# Patient Record
Sex: Male | Born: 1985 | Hispanic: Yes | Marital: Married | Smoking: Former smoker
Health system: Southern US, Community
[De-identification: ages and names within clinical notes are randomized; demographics above are authoritative.]

## PROBLEM LIST (undated history)

## (undated) DIAGNOSIS — R7303 Prediabetes: Secondary | ICD-10-CM

## (undated) HISTORY — PX: CHOLECYSTECTOMY: SHX55

## (undated) HISTORY — PX: COLONOSCOPY: SHX174

---

## 2004-06-23 ENCOUNTER — Emergency Department: Payer: Self-pay | Admitting: Emergency Medicine

## 2008-05-14 ENCOUNTER — Emergency Department: Payer: Self-pay | Admitting: Emergency Medicine

## 2008-06-05 ENCOUNTER — Ambulatory Visit: Payer: Self-pay | Admitting: Urology

## 2008-08-17 ENCOUNTER — Emergency Department: Payer: Self-pay | Admitting: Emergency Medicine

## 2008-12-04 ENCOUNTER — Emergency Department: Payer: Self-pay | Admitting: Emergency Medicine

## 2015-05-21 DIAGNOSIS — Z87891 Personal history of nicotine dependence: Secondary | ICD-10-CM | POA: Insufficient documentation

## 2015-05-21 DIAGNOSIS — Y998 Other external cause status: Secondary | ICD-10-CM | POA: Insufficient documentation

## 2015-05-21 DIAGNOSIS — W231XXA Caught, crushed, jammed, or pinched between stationary objects, initial encounter: Secondary | ICD-10-CM | POA: Insufficient documentation

## 2015-05-21 DIAGNOSIS — Y9389 Activity, other specified: Secondary | ICD-10-CM | POA: Insufficient documentation

## 2015-05-21 DIAGNOSIS — Y9289 Other specified places as the place of occurrence of the external cause: Secondary | ICD-10-CM | POA: Insufficient documentation

## 2015-05-21 DIAGNOSIS — S60413A Abrasion of left middle finger, initial encounter: Secondary | ICD-10-CM | POA: Insufficient documentation

## 2015-05-21 DIAGNOSIS — S60415A Abrasion of left ring finger, initial encounter: Secondary | ICD-10-CM | POA: Insufficient documentation

## 2015-05-22 ENCOUNTER — Emergency Department: Payer: Self-pay

## 2015-05-22 ENCOUNTER — Encounter: Payer: Self-pay | Admitting: Emergency Medicine

## 2015-05-22 ENCOUNTER — Emergency Department
Admission: EM | Admit: 2015-05-22 | Discharge: 2015-05-22 | Disposition: A | Discharge status: Home / Self Care | Payer: Self-pay | Attending: Emergency Medicine | Admitting: Emergency Medicine

## 2015-05-22 DIAGNOSIS — T148XXA Other injury of unspecified body region, initial encounter: Secondary | ICD-10-CM

## 2015-05-22 HISTORY — DX: Prediabetes: R73.03

## 2015-05-22 NOTE — ED Provider Notes (Signed)
[facility] Emergency Department Provider Note  ____________________________________________  Time seen: Approximately 1:54 AM  I have reviewed the triage vital signs and the nursing notes.   HISTORY  Chief Complaint Finger Injury    HPI Wesley Meyer is a 29 y.o. male patient reports he was getting money out of the machine and the money got stuck for a little bit and so the door closed on his finger and got the DIP joint of his left long finger and ring finger.  Past Medical History  Diagnosis Date  . Pre-diabetes     There are no active problems to display for this patient.   Past Surgical History  Procedure Laterality Date  . Cholecystectomy      No current outpatient prescriptions on file.  Allergies Review of patient's allergies indicates no known allergies.  No family history on file.  Social History Social History  Substance Use Topics  . Smoking status: Former Games developer  . Smokeless tobacco: None  . Alcohol Use: Yes    Review of Systems Constitutional: No fever/chills Eyes: No visual changes. ENT: No sore throat. Cardiovascular: Denies chest pain. Respiratory: Denies shortness of breath. Gastrointestinal: No abdominal pain.  No nausea, no vomiting.  No diarrhea.  No constipation. Genitourinary: Negative for dysuria. Musculoskeletal: Negative for back pain. Skin: Negative for rash.  10-point ROS otherwise negative.  ____________________________________________   PHYSICAL EXAM:  VITAL SIGNS: ED Triage Vitals  Enc Vitals Group     BP 05/22/15 0009 159/84 mmHg     Pulse Rate 05/22/15 0009 84     Resp 05/22/15 0009 19     Temp 05/22/15 0009 98.5 F (36.9 C)     Temp Source 05/22/15 0009 Oral     SpO2 05/22/15 0009 98 %     Weight 05/22/15 0009 360 lb (163.295 kg)     Height 05/22/15 0009 6' (1.829 m)     Head Cir --      Peak Flow --      Pain Score 05/22/15 0009 2     Pain Loc --      Pain Edu? --      Excl.  in GC? --     Constitutional: Alert and oriented. Well appearing and in no acute distress. Eyes: Conjunctivae are normal. PERRL. EOMI. Head: Atraumatic. Nose: No congestion/rhinnorhea. Mouth/Throat: Mucous membranes are moist.  Oropharynx non-erythematous. Musculoskeletal: No lower extremity tenderness nor edema.  No joint effusions.patient has superficial laceration over the DIP joint of both the left long and ring fingers. Sensation capillary refill is intact distally. Patient has good strength in the distal phalanx and is able to move both fingers distal phalanges through the full range of motion even when the middle phalanx is stabilized.  Neurologic:  Normal speech and language. No gross focal neurologic deficits are appreciated.  Skin:  Skin is warm, dry and intact. No rash noted. Psychiatric: Mood and affect are normal. Speech and behavior are normal.  ____________________________________________   LABS (all labs ordered are listed, but only abnormal results are displayed)  Labs Reviewed - No data to display ____________________________________________  EKG   ____________________________________________  RADIOLOGY  X-ray read by radiology as no osseous injury there is a small amount of skin disruption visible on x-ray ____________________________________________   PROCEDURES  Wound was cleaned and examined the skin appears to be tightly tear and I'm not sure that there is any deep injury at all I cannot find one. ____________________________________________   INITIAL IMPRESSION /  ASSESSMENT AND PLAN / ED COURSE  Pertinent labs & imaging results that were available during my care of the patient were reviewed by me and considered in my medical decision making (see chart for details).   ____________________________________________   FINAL CLINICAL IMPRESSION(S) / ED DIAGNOSES  Final diagnoses:  Superficial laceration      Arnaldo NatalPaul F Dontrell Stuck, MD 05/22/15 [phone]

## 2015-05-22 NOTE — ED Notes (Signed)
MD at bedside. 

## 2015-05-22 NOTE — ED Notes (Signed)
Pt presents to ED via POV with c/o of left side 2nd and 3rd digits. Pt states he was removing cash from ATM when the slid door closed on affected digits. Pt is alert and oriented x4, able to move all extremities without difficulty. CMS intact. Small amount of bleeding noted to both digits, bleeding controlled with triage bandage.

## 2015-05-22 NOTE — Discharge Instructions (Signed)
Keep area clean and dry. If you get it dirty changed bandage. You can wash her hands but do not submerge her hands in standing water. Make sure he changed manage to get your hands wet. Return for any redness swelling pus or increased pain or difficulty with moving her fingers.

## 2016-09-09 ENCOUNTER — Encounter: Payer: Self-pay | Admitting: Emergency Medicine

## 2016-09-09 ENCOUNTER — Emergency Department
Admission: EM | Admit: 2016-09-09 | Discharge: 2016-09-09 | Disposition: A | Discharge status: Home / Self Care | Payer: Self-pay | Attending: Emergency Medicine | Admitting: Emergency Medicine

## 2016-09-09 ENCOUNTER — Emergency Department: Payer: Self-pay

## 2016-09-09 DIAGNOSIS — Y9389 Activity, other specified: Secondary | ICD-10-CM | POA: Insufficient documentation

## 2016-09-09 DIAGNOSIS — Y999 Unspecified external cause status: Secondary | ICD-10-CM | POA: Insufficient documentation

## 2016-09-09 DIAGNOSIS — W1839XA Other fall on same level, initial encounter: Secondary | ICD-10-CM | POA: Insufficient documentation

## 2016-09-09 DIAGNOSIS — M25561 Pain in right knee: Secondary | ICD-10-CM

## 2016-09-09 DIAGNOSIS — Z87891 Personal history of nicotine dependence: Secondary | ICD-10-CM | POA: Insufficient documentation

## 2016-09-09 DIAGNOSIS — Y92007 Garden or yard of unspecified non-institutional (private) residence as the place of occurrence of the external cause: Secondary | ICD-10-CM | POA: Insufficient documentation

## 2016-09-09 MED ORDER — OXYCODONE-ACETAMINOPHEN 5-325 MG PO TABS: 1.0000 | ORAL_TABLET | Freq: Four times a day (QID) | ORAL | 0 refills | Status: DC | PRN

## 2016-09-09 MED ORDER — FENTANYL CITRATE (PF) 100 MCG/2ML IJ SOLN
100.0000 ug | Freq: Once | INTRAMUSCULAR | Status: AC
  Administered 2016-09-09: 100 ug via INTRAVENOUS

## 2016-09-09 MED ORDER — FENTANYL CITRATE (PF) 100 MCG/2ML IJ SOLN
INTRAMUSCULAR | Status: AC
  Administered 2016-09-09: 100 ug
  Filled 2016-09-09: qty 2

## 2016-09-09 MED ORDER — FENTANYL CITRATE (PF) 100 MCG/2ML IJ SOLN
INTRAMUSCULAR | Status: DC
Start: 2016-09-09 — End: 2016-09-09
  Filled 2016-09-09: qty 2

## 2016-09-09 NOTE — ED Triage Notes (Signed)
Patient comes from home via private car with leg pain. Patient was pulling a branch from the ground and pulled too hard and it broke and landed with all his weight on his right leg.

## 2016-09-09 NOTE — Discharge Instructions (Signed)
Please wear your knee immobilizer. Use crutches with weightbearing as tolerated. Please call the number provided for orthopedics on Monday to arrange a follow-up appointment early this week. Return to the emergency department for any personally concerning symptoms.

## 2016-09-09 NOTE — ED Notes (Signed)
Pt returned from xray

## 2016-09-09 NOTE — ED Provider Notes (Signed)
[facility] Emergency Department Provider Note  Time seen: 4:49 PM  I have reviewed the triage vital signs and the nursing notes.   HISTORY  Chief Complaint Leg Pain    HPI Wesley Meyer is a 31 y.o. male with no past medical history besides obesity, who presents to the emergency department with right knee pain. According to the patient he was working in the yard when he fell landing on a folded right leg. Patient states immediate pain to the knee, unable to bear weight due to the pain. States he felt a pop. Patient states 10/10 right knee pain upon arrival.  Past Medical History:  Diagnosis Date  . Pre-diabetes     There are no active problems to display for this patient.   Past Surgical History:  Procedure Laterality Date  . CHOLECYSTECTOMY      Prior to Admission medications   Not on File    No Known Allergies  No family history on file.  Social History Social History  Substance Use Topics  . Smoking status: Former Games developer  . Smokeless tobacco: Never Used  . Alcohol use Yes    Review of Systems Constitutional: Negative for fever. Cardiovascular: Negative for chest pain. Respiratory: Negative for shortness of breath. Gastrointestinal: Negative for abdominal pain Neurological: Negative for headache 10-point ROS otherwise negative.  ____________________________________________   PHYSICAL EXAM:  VITAL SIGNS: ED Triage Vitals [09/09/16 1640]  Enc Vitals Group     BP 134/79     Pulse Rate 93     Resp (!) 30     Temp      Temp src      SpO2 98 %     Weight (!) 380 lb (172.4 kg)     Height 6' (1.829 m)     Head Circumference      Peak Flow      Pain Score 10     Pain Loc      Pain Edu?      Excl. in GC?     Constitutional: Alert and oriented. Moderate distress due to right knee pain. Eyes: Normal exam ENT   Head: Normocephalic and atraumatic.   Mouth/Throat: Mucous membranes are moist. Cardiovascular:  Normal rate, regular rhythm. No murmur Respiratory: Normal respiratory effort without tachypnea nor retractions. Breath sounds are clear Gastrointestinal: Soft and nontender. No distention. Obese. Musculoskeletal: Patient with significant tenderness in knee especially just superior to the patella. Neurovascular intact distally with 2+ DP pulse. Able to move toes and feet. Neurologic:  Normal speech and language. No gross focal neurologic deficits  Skin:  Skin is warm, dry and intact.  Psychiatric: Mood and affect are normal.   ____________________________________________   RADIOLOGY  IMPRESSION: Tiny transverse lucency involving the posteromedial tibial plateau ridge possibly representing sequela of a PCL injury given patient's mechanism of injury and representing a nondisplaced fracture.  ____________________________________________   INITIAL IMPRESSION / ASSESSMENT AND PLAN / ED COURSE  Pertinent labs & imaging results that were available during my care of the patient were reviewed by me and considered in my medical decision making (see chart for details).  The patient presents to the emergency department with right lower extremity pain after a fall. Patient's pain is in the knee. Significant pain with palpation or any attempted movement. We will dose fentanyl for pain control we'll obtain x-ray imaging and closely monitor the patient.  Discussed the x-ray findings with Dr. Hyacinth Meeker of orthopedics. He suggests placing the patient in a  knee immobilizer. I discussed with Dr. Hyacinth Meeker that the patient is having difficulty with straight leg raises. He is able to slightly lift the leg off the bed with the leg in external rotation however when the leg is aligned straight he is not able to lift the leg off the bed. When bent at the knee the patient still cannot lift the foot off the bed. I lifted the foot passively and the patient is not able to maintain the foot off the bed. Dr. Hyacinth Meeker states he  will evaluate in the office. We'll place the patient in a knee immobilizer with crutches and weightbearing as tolerated place the patient on pain medication. Patient is agreeable.  ____________________________________________   FINAL CLINICAL IMPRESSION(S) / ED DIAGNOSES  Right knee pain    Minna Antis, MD 09/09/16 1819

## 2016-09-09 NOTE — ED Notes (Signed)
Knee immobilizer (the largest size) would not properly fit.  Dr. Lenard Lance OK'd the modifications

## 2016-09-13 DIAGNOSIS — S76111A Strain of right quadriceps muscle, fascia and tendon, initial encounter: Secondary | ICD-10-CM | POA: Insufficient documentation

## 2016-09-14 ENCOUNTER — Other Ambulatory Visit: Payer: Self-pay | Admitting: Specialist

## 2016-09-17 MED ORDER — CEFAZOLIN SODIUM-DEXTROSE 2-4 GM/100ML-% IV SOLN
2.0000 g | INTRAVENOUS | Status: AC
  Administered 2016-09-18: 2 g via INTRAVENOUS

## 2016-09-17 MED ORDER — CLINDAMYCIN PHOSPHATE 600 MG/50ML IV SOLN
600.0000 mg | Freq: Once | INTRAVENOUS | Status: AC
  Administered 2016-09-18: 600 mg via INTRAVENOUS

## 2016-09-18 ENCOUNTER — Ambulatory Visit
Admission: RE | Admit: 2016-09-18 | Discharge: 2016-09-18 | Disposition: A | Discharge status: Home / Self Care | Payer: Medicaid Other | Source: Ambulatory Visit | Attending: Specialist | Admitting: Specialist

## 2016-09-18 ENCOUNTER — Encounter: Payer: Self-pay | Admitting: *Deleted

## 2016-09-18 ENCOUNTER — Encounter
Admission: RE | Disposition: A | Discharge status: Home / Self Care | Payer: Self-pay | Source: Ambulatory Visit | Attending: Specialist

## 2016-09-18 ENCOUNTER — Ambulatory Visit: Payer: Medicaid Other | Admitting: Anesthesiology

## 2016-09-18 DIAGNOSIS — Z87891 Personal history of nicotine dependence: Secondary | ICD-10-CM | POA: Diagnosis not present

## 2016-09-18 DIAGNOSIS — X58XXXA Exposure to other specified factors, initial encounter: Secondary | ICD-10-CM | POA: Diagnosis not present

## 2016-09-18 DIAGNOSIS — S76191A Other specified injury of right quadriceps muscle, fascia and tendon, initial encounter: Secondary | ICD-10-CM | POA: Diagnosis not present

## 2016-09-18 HISTORY — PX: QUADRICEPS TENDON REPAIR: SHX756

## 2016-09-18 LAB — GLUCOSE, CAPILLARY
GLUCOSE-CAPILLARY: 152 mg/dL — AB (ref 65–99)
Glucose-Capillary: 118 mg/dL — ABNORMAL HIGH (ref 65–99)

## 2016-09-18 SURGERY — REPAIR, TENDON, QUADRICEPS
Anesthesia: General | Laterality: Right | Wound class: Clean

## 2016-09-18 MED ORDER — FENTANYL CITRATE (PF) 250 MCG/5ML IJ SOLN
INTRAMUSCULAR | Status: AC
  Filled 2016-09-18: qty 5

## 2016-09-18 MED ORDER — FAMOTIDINE 20 MG PO TABS
ORAL_TABLET | ORAL | Status: AC
  Filled 2016-09-18: qty 1

## 2016-09-18 MED ORDER — SUGAMMADEX SODIUM 500 MG/5ML IV SOLN
INTRAVENOUS | Status: DC | PRN
  Administered 2016-09-18: 350 mg via INTRAVENOUS

## 2016-09-18 MED ORDER — SUCCINYLCHOLINE CHLORIDE 20 MG/ML IJ SOLN
INTRAMUSCULAR | Status: DC | PRN
  Administered 2016-09-18: 200 mg via INTRAVENOUS

## 2016-09-18 MED ORDER — ONDANSETRON HCL 4 MG/2ML IJ SOLN
INTRAMUSCULAR | Status: DC | PRN
  Administered 2016-09-18: 4 mg via INTRAVENOUS

## 2016-09-18 MED ORDER — GABAPENTIN 400 MG PO CAPS
400.0000 mg | ORAL_CAPSULE | Freq: Once | ORAL | Status: AC
  Administered 2016-09-18: 400 mg via ORAL

## 2016-09-18 MED ORDER — FENTANYL CITRATE (PF) 100 MCG/2ML IJ SOLN
INTRAMUSCULAR | Status: DC | PRN
  Administered 2016-09-18: 50 ug via INTRAVENOUS
  Administered 2016-09-18: 100 ug via INTRAVENOUS
  Administered 2016-09-18: 50 ug via INTRAVENOUS
  Administered 2016-09-18: 100 ug via INTRAVENOUS

## 2016-09-18 MED ORDER — ROCURONIUM BROMIDE 50 MG/5ML IV SOLN
INTRAVENOUS | Status: AC
  Filled 2016-09-18: qty 1

## 2016-09-18 MED ORDER — SUGAMMADEX SODIUM 500 MG/5ML IV SOLN
INTRAVENOUS | Status: AC
  Filled 2016-09-18: qty 5

## 2016-09-18 MED ORDER — ROCURONIUM BROMIDE 100 MG/10ML IV SOLN
INTRAVENOUS | Status: DC | PRN
  Administered 2016-09-18: 20 mg via INTRAVENOUS
  Administered 2016-09-18: 50 mg via INTRAVENOUS

## 2016-09-18 MED ORDER — MIDAZOLAM HCL 2 MG/2ML IJ SOLN
INTRAMUSCULAR | Status: AC
  Filled 2016-09-18: qty 2

## 2016-09-18 MED ORDER — PROPOFOL 10 MG/ML IV BOLUS
INTRAVENOUS | Status: DC | PRN
  Administered 2016-09-18: 250 mg via INTRAVENOUS

## 2016-09-18 MED ORDER — CEFAZOLIN SODIUM-DEXTROSE 2-4 GM/100ML-% IV SOLN
INTRAVENOUS | Status: AC
  Filled 2016-09-18: qty 100

## 2016-09-18 MED ORDER — NEOMYCIN-POLYMYXIN B GU 40-200000 IR SOLN
Status: AC
  Filled 2016-09-18: qty 2

## 2016-09-18 MED ORDER — CHLORHEXIDINE GLUCONATE CLOTH 2 % EX PADS: 6.0000 | MEDICATED_PAD | Freq: Once | CUTANEOUS | Status: DC

## 2016-09-18 MED ORDER — MIDAZOLAM HCL 2 MG/2ML IJ SOLN
INTRAMUSCULAR | Status: DC | PRN
  Administered 2016-09-18: 2 mg via INTRAVENOUS

## 2016-09-18 MED ORDER — MELOXICAM 7.5 MG PO TABS
ORAL_TABLET | ORAL | Status: AC
  Filled 2016-09-18: qty 2

## 2016-09-18 MED ORDER — OXYCODONE HCL 5 MG PO TABS
5.0000 mg | ORAL_TABLET | Freq: Once | ORAL | Status: AC | PRN
  Administered 2016-09-18: 5 mg via ORAL

## 2016-09-18 MED ORDER — FENTANYL CITRATE (PF) 100 MCG/2ML IJ SOLN
INTRAMUSCULAR | Status: AC
  Administered 2016-09-18: 25 ug via INTRAVENOUS
  Filled 2016-09-18: qty 2

## 2016-09-18 MED ORDER — BUPIVACAINE-EPINEPHRINE (PF) 0.25% -1:200000 IJ SOLN
INTRAMUSCULAR | Status: AC
  Filled 2016-09-18: qty 30

## 2016-09-18 MED ORDER — OXYCODONE HCL 5 MG PO TABS
ORAL_TABLET | ORAL | Status: AC
  Administered 2016-09-18: 5 mg via ORAL
  Filled 2016-09-18: qty 1

## 2016-09-18 MED ORDER — NEOMYCIN-POLYMYXIN B GU 40-200000 IR SOLN
Status: DC | PRN
  Administered 2016-09-18: 4 mL

## 2016-09-18 MED ORDER — DEXAMETHASONE SODIUM PHOSPHATE 10 MG/ML IJ SOLN
INTRAMUSCULAR | Status: DC | PRN
  Administered 2016-09-18: 10 mg via INTRAVENOUS

## 2016-09-18 MED ORDER — DEXAMETHASONE SODIUM PHOSPHATE 10 MG/ML IJ SOLN
INTRAMUSCULAR | Status: AC
Start: 2016-09-18 — End: 2016-09-18
  Filled 2016-09-18: qty 1

## 2016-09-18 MED ORDER — GABAPENTIN 400 MG PO CAPS
ORAL_CAPSULE | ORAL | Status: AC
  Filled 2016-09-18: qty 1

## 2016-09-18 MED ORDER — OXYCODONE HCL 5 MG PO TABS
5.0000 mg | ORAL_TABLET | Freq: Once | ORAL | Status: AC
  Administered 2016-09-18: 5 mg via ORAL

## 2016-09-18 MED ORDER — FAMOTIDINE 20 MG PO TABS
20.0000 mg | ORAL_TABLET | Freq: Once | ORAL | Status: AC
  Administered 2016-09-18: 20 mg via ORAL

## 2016-09-18 MED ORDER — CLINDAMYCIN PHOSPHATE 600 MG/50ML IV SOLN
INTRAVENOUS | Status: AC
  Filled 2016-09-18: qty 50

## 2016-09-18 MED ORDER — BUPIVACAINE HCL (PF) 0.5 % IJ SOLN
INTRAMUSCULAR | Status: DC | PRN
  Administered 2016-09-18 (×2): 30 mL

## 2016-09-18 MED ORDER — SODIUM CHLORIDE 0.9 % IV SOLN
INTRAVENOUS | Status: DC
  Administered 2016-09-18 (×2): via INTRAVENOUS

## 2016-09-18 MED ORDER — MELOXICAM 7.5 MG PO TABS
15.0000 mg | ORAL_TABLET | Freq: Once | ORAL | Status: AC
  Administered 2016-09-18: 15 mg via ORAL

## 2016-09-18 MED ORDER — OXYMETAZOLINE HCL 0.05 % NA SOLN
NASAL | Status: AC
  Filled 2016-09-18: qty 15

## 2016-09-18 MED ORDER — ONDANSETRON HCL 4 MG/2ML IJ SOLN
INTRAMUSCULAR | Status: AC
  Filled 2016-09-18: qty 2

## 2016-09-18 MED ORDER — ACETAMINOPHEN 10 MG/ML IV SOLN
INTRAVENOUS | Status: AC
  Filled 2016-09-18: qty 100

## 2016-09-18 MED ORDER — BUPIVACAINE HCL (PF) 0.5 % IJ SOLN
INTRAMUSCULAR | Status: AC
  Filled 2016-09-18: qty 30

## 2016-09-18 MED ORDER — LIDOCAINE HCL (CARDIAC) 20 MG/ML IV SOLN
INTRAVENOUS | Status: DC | PRN
  Administered 2016-09-18: 100 mg via INTRAVENOUS

## 2016-09-18 MED ORDER — BUPIVACAINE-EPINEPHRINE (PF) 0.5% -1:200000 IJ SOLN
INTRAMUSCULAR | Status: AC
  Filled 2016-09-18: qty 30

## 2016-09-18 MED ORDER — GABAPENTIN 400 MG PO CAPS: 400.0000 mg | ORAL_CAPSULE | Freq: Two times a day (BID) | ORAL | 3 refills | Status: DC

## 2016-09-18 MED ORDER — FENTANYL CITRATE (PF) 100 MCG/2ML IJ SOLN
25.0000 ug | INTRAMUSCULAR | Status: DC | PRN
  Administered 2016-09-18 (×4): 25 ug via INTRAVENOUS

## 2016-09-18 MED ORDER — FENTANYL CITRATE (PF) 100 MCG/2ML IJ SOLN
INTRAMUSCULAR | Status: AC
  Filled 2016-09-18: qty 2

## 2016-09-18 MED ORDER — OXYCODONE HCL 5 MG/5ML PO SOLN: 5.0000 mg | Freq: Once | ORAL | Status: AC | PRN

## 2016-09-18 MED ORDER — HYDROCODONE-ACETAMINOPHEN 7.5-325 MG PO TABS: 1.0000 | ORAL_TABLET | Freq: Four times a day (QID) | ORAL | 0 refills | Status: DC | PRN

## 2016-09-18 MED ORDER — LIDOCAINE HCL 2 % EX GEL
CUTANEOUS | Status: AC
  Filled 2016-09-18: qty 5

## 2016-09-18 SURGICAL SUPPLY — 30 items
CANISTER SUCT 1200ML W/VALVE (MISCELLANEOUS) ×2 IMPLANT
CAST PADDING 6X4YD ST 30248 (SOFTGOODS) ×1
CHLORAPREP W/TINT 26ML (MISCELLANEOUS) ×2 IMPLANT
COOLER POLAR GLACIER W/PUMP (MISCELLANEOUS) ×2 IMPLANT
CUFF TOURN 24 STER (MISCELLANEOUS) IMPLANT
CUFF TOURN 30 STER DUAL PORT (MISCELLANEOUS) IMPLANT
ELECT REM PT RETURN 9FT ADLT (ELECTROSURGICAL) ×2
ELECTRODE REM PT RTRN 9FT ADLT (ELECTROSURGICAL) ×1 IMPLANT
GAUZE SPONGE 4X4 12PLY STRL (GAUZE/BANDAGES/DRESSINGS) ×2 IMPLANT
GAUZE XEROFORM 4X4 STRL (GAUZE/BANDAGES/DRESSINGS) ×2 IMPLANT
GLOVE INDICATOR 8.0 STRL GRN (GLOVE) ×2 IMPLANT
GLOVE SURG ORTHO 8.5 STRL (GLOVE) ×2 IMPLANT
GOWN STRL REUS W/ TWL LRG LVL3 (GOWN DISPOSABLE) ×1 IMPLANT
GOWN STRL REUS W/TWL LRG LVL3 (GOWN DISPOSABLE) ×1
GOWN STRL REUS W/TWL LRG LVL4 (GOWN DISPOSABLE) ×2 IMPLANT
IMMOB KNEE 24 THIGH 24 443303 (SOFTGOODS) ×2 IMPLANT
KIT RM TURNOVER STRD PROC AR (KITS) ×2 IMPLANT
NEEDLE SPNL 20GX3.5 QUINCKE YW (NEEDLE) ×2 IMPLANT
NS IRRIG 1000ML POUR BTL (IV SOLUTION) ×2 IMPLANT
PACK TOTAL KNEE (MISCELLANEOUS) ×2 IMPLANT
PAD WRAPON POLAR KNEE (MISCELLANEOUS) ×1 IMPLANT
PADDING CAST COTTON 6X4 ST (SOFTGOODS) ×1 IMPLANT
PASSER SUT SWANSON 36MM LOOP (INSTRUMENTS) ×2 IMPLANT
STAPLER SKIN PROX 35W (STAPLE) ×2 IMPLANT
STOCKINETTE BIAS CUT 6 980064 (GAUZE/BANDAGES/DRESSINGS) ×2 IMPLANT
SUT ORTHOCORD OS-6 NDL 36 (SUTURE) ×2 IMPLANT
SUT VIC AB 2-0 CT1 27 (SUTURE) ×1
SUT VIC AB 2-0 CT1 TAPERPNT 27 (SUTURE) ×1 IMPLANT
SYR 30ML LL (SYRINGE) ×4 IMPLANT
WRAPON POLAR PAD KNEE (MISCELLANEOUS) ×2

## 2016-09-18 NOTE — Discharge Instructions (Signed)

## 2016-09-18 NOTE — Op Note (Signed)
   09/18/2016  3:43 PM  PATIENT:  Wesley Meyer    PRE-OPERATIVE DIAGNOSIS:  S76.191A Inj right quadriceps muscle, fascia and tendon, init encntr  POST-OPERATIVE DIAGNOSIS:  Same  PROCEDURE:  REPAIR QUADRICEP TENDON  SURGEON:  Valinda Hoar, MD  ANESTHESIA:   General  PREOPERATIVE INDICATIONS:  [name] is a  31 y.o. male with a diagnosis of S76.191A Inj right quadriceps muscle, fascia and tendon, init encntr who failed conservative measures and elected for surgical management.    The risks benefits and alternatives were discussed with the patient preoperatively including but not limited to the risks of infection, bleeding, nerve injury, cardiopulmonary complications, the need for revision surgery, among others, and the patient was willing to proceed.  EBL: minimal  TOURNIQUET TIME: 30  MIN  OPERATIVE IMPLANTS: #5 Ethibond suture  OPERATIVE FINDINGS: Rupture of vastus medialis   OPERATIVE PROCEDURE: The patient was brought to the operating room and underwent general endotracheal anesthesia in the supine position. The operative leg was prepped and draped in a sterile fashion. Esmarch was applied and tourniquet inflated to 350 mmHg. An anterior midline incision was made and dissection carried out sharply through subcutaneous tissue. . The vastus medialis was seen to be torn from the patella medially with a large defect present into the joint. The vastus intermedius was intact.   A large amount of blood was removed with clots as well. The tendon ends were freshened up. The muscle and tendon was repaired with #5 Ethibond sutures. After irrigation,  subcutaneous tissue was closed with 2-0 Vicryl and the skin was closed with staples. Sponge and needle counts were correct. 1/2% Sensorcaine was placed in the soft tissues. Dry sterile dressing was applied along with  knee immobilizer. Tourniquet was deflated with good return of blood flow to the foot. The patient was  awakened and taken to recovery in good condition.  Valinda Hoar, MD

## 2016-09-18 NOTE — Anesthesia Postprocedure Evaluation (Signed)
Anesthesia Post Note  Patient: Wesley Meyer  Procedure(s) Performed: Procedure(s) (LRB): REPAIR QUADRICEP TENDON (Right)  Patient location during evaluation: PACU Anesthesia Type: General Level of consciousness: awake and alert and oriented Pain management: pain level controlled Vital Signs Assessment: post-procedure vital signs reviewed and stable Respiratory status: spontaneous breathing, nonlabored ventilation and respiratory function stable Cardiovascular status: blood pressure returned to baseline and stable Postop Assessment: no signs of nausea or vomiting Anesthetic complications: no     Last Vitals:  Vitals:   09/18/16 1617 09/18/16 1621  BP:  (!) 141/84  Pulse: 82 77  Resp: 19 (!) 26  Temp:      Last Pain:  Vitals:   09/18/16 1636  TempSrc:   PainSc: 6                  Shaelynn Dragos

## 2016-09-18 NOTE — H&P (Signed)
THE PATIENT WAS SEEN PRIOR TO SURGERY TODAY.  HISTORY, ALLERGIES, HOME MEDICATIONS AND OPERATIVE PROCEDURE WERE REVIEWED. RISKS AND BENEFITS OF SURGERY DISCUSSED WITH PATIENT AGAIN.  NO CHANGES FROM INITIAL HISTORY AND PHYSICAL NOTED.    

## 2016-09-18 NOTE — Anesthesia Post-op Follow-up Note (Cosign Needed)
Anesthesia QCDR form completed.        

## 2016-09-18 NOTE — Anesthesia Preprocedure Evaluation (Addendum)
Anesthesia Evaluation  Patient identified by MRN, date of birth, ID band Patient awake    Reviewed: Allergy & Precautions, H&P , NPO status , Patient's Chart, lab work & pertinent test results  History of Anesthesia Complications Negative for: history of anesthetic complications  Airway Mallampati: II  TM Distance: >3 FB Neck ROM: full    Dental  (+) Poor Dentition, Chipped, Missing, Partial Upper   Pulmonary neg shortness of breath, former smoker,    Pulmonary exam normal breath sounds clear to auscultation       Cardiovascular Exercise Tolerance: Good (-) angina(-) Past MI and (-) DOE negative cardio ROS Normal cardiovascular exam Rhythm:regular Rate:Normal     Neuro/Psych negative neurological ROS  negative psych ROS   GI/Hepatic Neg liver ROS, GERD  Controlled,  Endo/Other  negative endocrine ROS  Renal/GU      Musculoskeletal   Abdominal   Peds  Hematology negative hematology ROS (+)   Anesthesia Other Findings Signs and symptoms suggestive of sleep apnea   Past Medical History: No date: Pre-diabetes  Past Surgical History: No date: CHOLECYSTECTOMY No date: COLONOSCOPY  BMI    Body Mass Index:  52.22 kg/m      Reproductive/Obstetrics negative OB ROS                            Anesthesia Physical Anesthesia Plan  ASA: III  Anesthesia Plan: General ETT   Post-op Pain Management:    Induction: Intravenous  Airway Management Planned: Oral ETT  Additional Equipment:   Intra-op Plan:   Post-operative Plan: Extubation in OR  Informed Consent: I have reviewed the patients History and Physical, chart, labs and discussed the procedure including the risks, benefits and alternatives for the proposed anesthesia with the patient or authorized representative who has indicated his/her understanding and acceptance.   Dental Advisory Given  Plan Discussed with:  Anesthesiologist, CRNA and Surgeon  Anesthesia Plan Comments: (Patient consented for risks of anesthesia including but not limited to:  - adverse reactions to medications - damage to teeth, lips or other oral mucosa - sore throat or hoarseness - Damage to heart, brain, lungs or loss of life  Patient voiced understanding.)        Anesthesia Quick Evaluation

## 2016-09-18 NOTE — Anesthesia Procedure Notes (Signed)
Procedure Name: Intubation Date/Time: 09/18/2016 2:40 PM Performed by: Jonna Clark Pre-anesthesia Checklist: Patient identified, Patient being monitored, Timeout performed, Emergency Drugs available and Suction available Patient Re-evaluated:Patient Re-evaluated prior to inductionOxygen Delivery Method: Circle system utilized Preoxygenation: Pre-oxygenation with 100% oxygen Intubation Type: IV induction Ventilation: Mask ventilation without difficulty Laryngoscope Size: Mac, Glidescope and 4 Grade View: Grade I Tube type: Oral Tube size: 7.5 mm Number of attempts: 1 Airway Equipment and Method: Stylet Placement Confirmation: ETT inserted through vocal cords under direct vision,  positive ETCO2 and breath sounds checked- equal and bilateral Secured at: 22 cm Tube secured with: Tape Dental Injury: Teeth and Oropharynx as per pre-operative assessment

## 2016-09-18 NOTE — Transfer of Care (Signed)
Immediate Anesthesia Transfer of Care Note  Patient: Wesley Meyer  Procedure(s) Performed: Procedure(s): REPAIR QUADRICEP TENDON (Right)  Patient Location: PACU  Anesthesia Type:General  Level of Consciousness: lethargic  Airway & Oxygen Therapy: Patient Spontanous Breathing and Patient connected to face mask oxygen  Post-op Assessment: Report given to RN and Post -op Vital signs reviewed and stable  Post vital signs: Reviewed and stable  Last Vitals:  Vitals:   09/18/16 1308 09/18/16 1551  BP: 139/68 (!) 180/100  Pulse: 83 90  Resp: 18 (!) 22  Temp: 36.4 C 36.7 C    Last Pain:  Vitals:   09/18/16 1308  TempSrc: Tympanic  PainSc: 7          Complications: No apparent anesthesia complications

## 2016-09-19 ENCOUNTER — Encounter: Payer: Self-pay | Admitting: Specialist

## 2016-09-29 DIAGNOSIS — S76119A Strain of unspecified quadriceps muscle, fascia and tendon, initial encounter: Secondary | ICD-10-CM | POA: Insufficient documentation

## 2016-10-26 ENCOUNTER — Emergency Department: Payer: Medicaid Other

## 2016-10-26 ENCOUNTER — Emergency Department
Admission: EM | Admit: 2016-10-26 | Discharge: 2016-10-26 | Disposition: A | Discharge status: Home / Self Care | Payer: Medicaid Other | Attending: Emergency Medicine | Admitting: Emergency Medicine

## 2016-10-26 ENCOUNTER — Encounter: Payer: Self-pay | Admitting: Emergency Medicine

## 2016-10-26 DIAGNOSIS — N200 Calculus of kidney: Secondary | ICD-10-CM | POA: Diagnosis not present

## 2016-10-26 DIAGNOSIS — Z87891 Personal history of nicotine dependence: Secondary | ICD-10-CM | POA: Diagnosis not present

## 2016-10-26 DIAGNOSIS — K5903 Drug induced constipation: Secondary | ICD-10-CM

## 2016-10-26 DIAGNOSIS — R109 Unspecified abdominal pain: Secondary | ICD-10-CM | POA: Diagnosis present

## 2016-10-26 LAB — URINALYSIS, COMPLETE (UACMP) WITH MICROSCOPIC
Bacteria, UA: NONE SEEN
Bilirubin Urine: NEGATIVE
Glucose, UA: NEGATIVE mg/dL
Ketones, ur: NEGATIVE mg/dL
NITRITE: NEGATIVE
PH: 6 (ref 5.0–8.0)
Protein, ur: 30 mg/dL — AB
SPECIFIC GRAVITY, URINE: 1.02 (ref 1.005–1.030)

## 2016-10-26 LAB — BASIC METABOLIC PANEL
ANION GAP: 9 (ref 5–15)
BUN: 13 mg/dL (ref 6–20)
CALCIUM: 8.9 mg/dL (ref 8.9–10.3)
CO2: 24 mmol/L (ref 22–32)
Chloride: 100 mmol/L — ABNORMAL LOW (ref 101–111)
Creatinine, Ser: 0.95 mg/dL (ref 0.61–1.24)
Glucose, Bld: 200 mg/dL — ABNORMAL HIGH (ref 65–99)
Potassium: 4.1 mmol/L (ref 3.5–5.1)
Sodium: 133 mmol/L — ABNORMAL LOW (ref 135–145)

## 2016-10-26 MED ORDER — TAMSULOSIN HCL 0.4 MG PO CAPS
0.4000 mg | ORAL_CAPSULE | Freq: Once | ORAL | Status: AC
  Administered 2016-10-26: 0.4 mg via ORAL
  Filled 2016-10-26: qty 1

## 2016-10-26 MED ORDER — FENTANYL CITRATE (PF) 100 MCG/2ML IJ SOLN
50.0000 ug | INTRAMUSCULAR | Status: DC | PRN
  Administered 2016-10-26: 50 ug via INTRAVENOUS
  Filled 2016-10-26: qty 2

## 2016-10-26 MED ORDER — OXYCODONE-ACETAMINOPHEN 7.5-325 MG PO TABS: 1.0000 | ORAL_TABLET | Freq: Four times a day (QID) | ORAL | 0 refills | Status: DC | PRN

## 2016-10-26 MED ORDER — BISACODYL 5 MG PO TBEC: 10.0000 mg | DELAYED_RELEASE_TABLET | Freq: Every day | ORAL | 0 refills | Status: DC | PRN

## 2016-10-26 MED ORDER — FENTANYL CITRATE (PF) 100 MCG/2ML IJ SOLN
100.0000 ug | Freq: Once | INTRAMUSCULAR | Status: AC
  Administered 2016-10-26: 100 ug via INTRAVENOUS
  Filled 2016-10-26: qty 2

## 2016-10-26 MED ORDER — KETOROLAC TROMETHAMINE 30 MG/ML IJ SOLN
15.0000 mg | Freq: Once | INTRAMUSCULAR | Status: AC
  Administered 2016-10-26: 15 mg via INTRAVENOUS
  Filled 2016-10-26: qty 1

## 2016-10-26 MED ORDER — FENTANYL CITRATE (PF) 100 MCG/2ML IJ SOLN: 150.0000 ug | Freq: Once | INTRAMUSCULAR | Status: DC

## 2016-10-26 MED ORDER — ONDANSETRON HCL 4 MG/2ML IJ SOLN
4.0000 mg | Freq: Once | INTRAMUSCULAR | Status: AC
  Administered 2016-10-26: 4 mg via INTRAVENOUS
  Filled 2016-10-26: qty 2

## 2016-10-26 NOTE — ED Triage Notes (Signed)
Sudden onset 4am left flank pain and heamturea.  Nausea now.

## 2016-10-26 NOTE — Discharge Instructions (Signed)
The most important thing with a kidney stone is that you need to return immediately to the emergency department if you develop a fever or chills at all. Please make an appointment to follow-up with a urologist within the next week for recheck. Return to the emergency department sooner for any concerns.  It was a pleasure to take care of you today, and thank you for coming to our emergency department.  If you have any questions or concerns before leaving please ask the nurse to grab me and I'm more than happy to go through your aftercare instructions again.  If you were prescribed any opioid pain medication today such as Norco, Vicodin, Percocet, morphine, hydrocodone, or oxycodone please make sure you do not drive when you are taking this medication as it can alter your ability to drive safely.  If you have any concerns once you are home that you are not improving or are in fact getting worse before you can make it to your follow-up appointment, please do not hesitate to call 911 and come back for further evaluation.  Merrily Brittle MD  Results for orders placed or performed during the hospital encounter of 10/26/16  Urinalysis, Complete w Microscopic  Result Value Ref Range   Color, Urine YELLOW (A) YELLOW   APPearance HAZY (A) CLEAR   Specific Gravity, Urine 1.020 1.005 - 1.030   pH 6.0 5.0 - 8.0   Glucose, UA NEGATIVE NEGATIVE mg/dL   Hgb urine dipstick LARGE (A) NEGATIVE   Bilirubin Urine NEGATIVE NEGATIVE   Ketones, ur NEGATIVE NEGATIVE mg/dL   Protein, ur 30 (A) NEGATIVE mg/dL   Nitrite NEGATIVE NEGATIVE   Leukocytes, UA TRACE (A) NEGATIVE   RBC / HPF TOO NUMEROUS TO COUNT 0 - 5 RBC/hpf   WBC, UA 6-30 0 - 5 WBC/hpf   Bacteria, UA NONE SEEN NONE SEEN   Squamous Epithelial / LPF 0-5 (A) NONE SEEN   Mucous PRESENT    Ct Renal Stone Study  Result Date: 10/26/2016 CLINICAL DATA:  Left flank pain hematuria EXAM: CT ABDOMEN AND PELVIS WITHOUT CONTRAST TECHNIQUE: Multidetector CT  imaging of the abdomen and pelvis was performed following the standard protocol without oral or intravenous contrast material administration. COMPARISON:  December 04, 2008 FINDINGS: Lower chest: Lung bases are clear. Hepatobiliary: Liver measures 25.8 cm in length. There is evidence of a degree of hepatic steatosis. No focal liver lesions are evident on this noncontrast enhanced study. Gallbladder is absent. There is no biliary duct dilatation. Pancreas: No pancreatic mass or inflammatory focus. Spleen: No splenic lesions are evident. Spleen measures 15.0 x 15.6 x 8.6 cm with a measured splenic volume of 1,006 cubic cm. Adrenals/Urinary Tract: Adrenals appear normal bilaterally. Right kidney appears normal. Left kidney is edematous. There is no renal mass on either side. There is no hydronephrosis on the right. There is moderate hydronephrosis on the left. There is a 3 mm calculus in the lower pole left kidney with a nearby 1 mm calculus. Slightly more inferiorly on the left, there is a 3 x 3 mm calculus, nonobstructing. There is also a 1 mm adjacent calculus in this area. There is a calculus in the proximal left ureter just beyond the ureteropelvic junction measuring 7 x 5 mm. There is a 2 x 1 mm calculus immediately proximal to this larger calculus in the proximal left ureter. No other ureteral calculi are identified on either side. Urinary bladder is largely decompressed. Urinary bladder wall thickness is upper normal given the  degree of bladder decompression. Stomach/Bowel: There are multiple diverticula throughout the sigmoid colon. There is localized wall thickening with surrounding edema in the mid to distal sigmoid colon, inferior to the urinary bladder. There is no frank abscess or perforation in this area. Several prominent epiploic appendages are noted in this area. There is no other evidence of bowel wall or mesenteric thickening. No bowel obstruction. No free air or portal venous air. Vascular/Lymphatic:  There is no abdominal aortic aneurysm. No vascular lesions are evident on this noncontrast study. There is no adenopathy abdomen or pelvis. Reproductive: Prostate and seminal vesicles appear normal in size and contour. No pelvic mass evident. Other: Appendix appears normal. No abscess or ascites evident in the abdomen or pelvis. There is fat in the right inguinal ring. There is a small ventral hernia containing only fat. Musculoskeletal: There are foci of degenerative change in the lower thoracic and lumbar spine regions. There are no blastic or lytic bone lesions. There is no intramuscular or abdominal wall lesion. IMPRESSION: Moderate hydronephrosis on the left with left renal edema. There is a 7 x 5 mm calculus in the proximal left ureter slightly beyond the ureteropelvic junction. Just proximal to this calculus, there is a 2 x 1 mm calculus in the proximal left ureter as well. There are nonobstructing calculi in the lower pole left kidney. Probable epiploic appendagitis in the sigmoid colon region with localized wall thickening and mild mesenteric fluid/inflammation. There may be a degree of superimposed diverticulitis, although there is no focal irregular diverticulum apparent by CT. 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 There is urinary bladder wall thickening just superior to this inflammation involving the mid to distal sigmoid colon. This wall thickening may be secondary to the nearby inflammation as opposed to cystitis arising from the bladder. There is hepatomegaly and splenomegaly of uncertain etiology. There is hepatic steatosis. Appendix appears normal.  No abscess.  Gallbladder absent. Small ventral hernia containing only fat. Electronically Signed   By: Bretta BangWilliam  Woodruff III M.D.   On: 10/26/2016 10:42

## 2016-10-26 NOTE — ED Provider Notes (Signed)
[facility] Emergency Department Provider Note  ____________________________________________   First MD Initiated Contact with Patient 10/26/16 1012     (approximate)  I have reviewed the triage vital signs and the nursing notes.   HISTORY  Chief Complaint Flank Pain and Hematuria    HPI Wesley Meyer is a 31 y.o. male who self presents to the emergency department with severe left flank pain radiating to his left groin that woke him from sleep at 4 in the morning roughly 5 hours prior to arrival. He does have a previous history of renal colic 6 years ago and he said this feels identical. His pain is severe and not alleviated by anything. Nothing seems to make it worse. He has frank gross hematuria at this time. He denies trauma..   Past Medical History:  Diagnosis Date  . Pre-diabetes     There are no active problems to display for this patient.   Past Surgical History:  Procedure Laterality Date  . CHOLECYSTECTOMY    . COLONOSCOPY    . QUADRICEPS TENDON REPAIR Right 09/18/2016   Procedure: REPAIR QUADRICEP TENDON;  Surgeon: Deeann SaintHoward Miller, MD;  Location: [facility] ORS;  Service: Orthopedics;  Laterality: Right;    Prior to Admission medications   Medication Sig Start Date End Date Taking? Authorizing Provider  docusate sodium (COLACE) 100 MG capsule Take 200 mg by mouth daily as needed for mild constipation.   Yes [provider]  gabapentin (NEURONTIN) 400 MG capsule Take 1 capsule (400 mg total) by mouth 2 (two) times daily. 09/18/16  Yes Deeann SaintMiller, Howard, MD  bisacodyl (DULCOLAX) 5 MG EC tablet Take 2 tablets (10 mg total) by mouth daily as needed for moderate constipation. 10/26/16 10/26/17  Merrily Brittleifenbark, Erica Richwine, MD  oxyCODONE-acetaminophen (PERCOCET) 7.5-325 MG tablet Take 1 tablet by mouth every 6 (six) hours as needed for severe pain. 10/26/16 10/26/17  Merrily Brittleifenbark, Kayshawn Ozburn, MD    Allergies Patient has no known allergies.  Family History    Problem Relation Age of Onset  . Diabetes Father     Social History Social History  Substance Use Topics  . Smoking status: Former Games developermoker  . Smokeless tobacco: Never Used  . Alcohol use Yes     Comment: occassional    Review of Systems Constitutional: No fever/chills Eyes: No visual changes. ENT: No sore throat. Cardiovascular: Denies chest pain. Respiratory: Denies shortness of breath. Gastrointestinal: Positive abdominal pain.  Positive nausea, no vomiting.  No diarrhea.  No constipation. Genitourinary: Negative for dysuria. Musculoskeletal: Positive for back pain. Skin: Negative for rash. Neurological: Negative for headaches, focal weakness or numbness.  ____________________________________________   PHYSICAL EXAM:  VITAL SIGNS: ED Triage Vitals  Enc Vitals Group     BP 10/26/16 0942 (!) 163/104     Pulse Rate 10/26/16 0942 80     Resp 10/26/16 0942 18     Temp 10/26/16 0942 97.6 F (36.4 C)     Temp Source 10/26/16 0942 Oral     SpO2 10/26/16 0942 99 %     Weight 10/26/16 0943 (!) 385 lb (174.6 kg)     Height 10/26/16 0943 6' (1.829 m)     Head Circumference --      Peak Flow --      Pain Score 10/26/16 0942 10     Pain Loc --      Pain Edu? --      Excl. in GC? --     Constitutional: Alert and oriented x 4  Appears extremely uncomfortable grimacing in pain Eyes: PERRL EOMI. Head: Atraumatic. Nose: No congestion/rhinnorhea. Mouth/Throat: No trismus Neck: No stridor.   Cardiovascular: Normal rate, regular rhythm. Grossly normal heart sounds.  Good peripheral circulation. Respiratory: Normal respiratory effort.  No retractions. Lungs CTAB and moving good air Gastrointestinal: Obese abdomen soft nontender no costovertebral tenderness no McBurney's tenderness Musculoskeletal: No lower extremity edema   Neurologic:  Normal speech and language. No gross focal neurologic deficits are appreciated. Skin:  Skin is warm, dry and intact. No rash  noted. Psychiatric: Mood and affect are normal. Speech and behavior are normal.    ____________________________________________   DIFFERENTIAL  Renal colic, pyelonephritis, volvulus ____________________________________________   LABS (all labs ordered are listed, but only abnormal results are displayed)  Labs Reviewed  BASIC METABOLIC PANEL - Abnormal; Notable for the following:       Result Value   Sodium 133 (*)    Chloride 100 (*)    Glucose, Bld 200 (*)    All other components within normal limits  URINALYSIS, COMPLETE (UACMP) WITH MICROSCOPIC - Abnormal; Notable for the following:    Color, Urine YELLOW (*)    APPearance HAZY (*)    Hgb urine dipstick LARGE (*)    Protein, ur 30 (*)    Leukocytes, UA TRACE (*)    Squamous Epithelial / LPF 0-5 (*)    All other components within normal limits    Hematuria with no signs of infection __________________________________________  EKG   ____________________________________________  RADIOLOGY  7 mm left-sided proximal kidney stone ____________________________________________   PROCEDURES  Procedure(s) performed: no  Procedures  Critical Care performed: no  Observation: no ____________________________________________   INITIAL IMPRESSION / ASSESSMENT AND PLAN / ED COURSE  Pertinent labs & imaging results that were available during my care of the patient were reviewed by me and considered in my medical decision making (see chart for details).  The patient has a large left-sided kidney stone although fortunately he has normal renal function and no signs of urinary tract infection or pyelonephritis. He feels significantly improved after receiving 150 g of fentanyl along with Toradol. He currently is taking hydrocodone for right sided knee injury and he said it was not adequate to help his kidney stone today so I will switch him instead to oxycodone. He understands not to take the 2 medications together. I will  refer him to urology outpatient as well as beginning him on Flomax. Fever precautions given. He is discharged home with his wife in improved condition.      ____________________________________________   FINAL CLINICAL IMPRESSION(S) / ED DIAGNOSES  Final diagnoses:  Kidney stone  Drug-induced constipation      NEW MEDICATIONS STARTED DURING THIS VISIT:  Discharge Medication List as of 10/26/2016 12:23 PM    START taking these medications   Details  bisacodyl (DULCOLAX) 5 MG EC tablet Take 2 tablets (10 mg total) by mouth daily as needed for moderate constipation., Starting Thu 10/26/2016, Until Fri 10/26/2017, Print    oxyCODONE-acetaminophen (PERCOCET) 7.5-325 MG tablet Take 1 tablet by mouth every 6 (six) hours as needed for severe pain., Starting Thu 10/26/2016, Until Fri 10/26/2017, Print         Note:  This document was prepared using Dragon voice recognition software and may include unintentional dictation errors.     Merrily Brittle, MD 10/26/16 [id number]

## 2016-10-30 ENCOUNTER — Encounter: Payer: Self-pay | Admitting: Urology

## 2016-10-30 ENCOUNTER — Ambulatory Visit (INDEPENDENT_AMBULATORY_CARE_PROVIDER_SITE_OTHER): Payer: Self-pay | Admitting: Urology

## 2016-10-30 VITALS — BP 151/94 | HR 94 | Ht 72.0 in | Wt 385.0 lb

## 2016-10-30 DIAGNOSIS — N200 Calculus of kidney: Secondary | ICD-10-CM

## 2016-10-30 DIAGNOSIS — N1330 Unspecified hydronephrosis: Secondary | ICD-10-CM

## 2016-10-30 DIAGNOSIS — N201 Calculus of ureter: Secondary | ICD-10-CM

## 2016-10-30 MED ORDER — TAMSULOSIN HCL 0.4 MG PO CAPS: 0.4000 mg | ORAL_CAPSULE | Freq: Every day | ORAL | 0 refills | Status: DC

## 2016-10-30 NOTE — Progress Notes (Signed)
10/30/2016 7:42 AM   Wesley BalesGilberto Meyer 08/17/1985 [id number]  Referring provider: No referring provider defined for this encounter.  Chief Complaint  Patient presents with  . Nephrolithiasis    HPI: 31 year old male with a personal history of nephrolithiasis who presented to the emergency room on 10/26/2016 with acute onset severe left flank pain. He is found to have a 7 x 5 mm left obstructing proximal ureteral calculus with hydronephrosis.  His pain was ultimately able to be controlled in the emergency room and is discharged home with outpatient follow-up.  Her pain for 1-2 days following his emergency room visit. A few days ago, he passed very small piece of amorphous material which he brings with him today and felt that this is likely his stone. He has had some dull left flank pain today but not severe. History is uric consistently until 2 days ago when he thought he passed a stone.  He denies any dysuria, gross hematuria, fevers, or chills. No nausea or vomiting. His UA today is completely negative.  Labs in the ER including BMP were otherwise unremarkable.  He is a personal history of kidney stones in last passed a stone approximately 6 years ago. He is passed several over his lifetime but never required any surgical intervention for these.  He recently underwent quadriceps tendon repair and has been immobilized at home since surgery. He is planning on starting physical therapy tomorrow.  PMH: Past Medical History:  Diagnosis Date  . Pre-diabetes     Surgical History: Past Surgical History:  Procedure Laterality Date  . CHOLECYSTECTOMY    . COLONOSCOPY    . QUADRICEPS TENDON REPAIR Right 09/18/2016   Procedure: REPAIR QUADRICEP TENDON;  Surgeon: Deeann SaintHoward Miller, MD;  Location: [facility] ORS;  Service: Orthopedics;  Laterality: Right;    Home Medications:  Allergies as of 10/30/2016   No Known Allergies     Medication List       Accurate as of 10/30/16 11:59 PM. Always  use your most recent med list.          bisacodyl 5 MG EC tablet Commonly known as:  DULCOLAX Take 2 tablets (10 mg total) by mouth daily as needed for moderate constipation.   docusate sodium 100 MG capsule Commonly known as:  COLACE Take 200 mg by mouth daily as needed for mild constipation.   gabapentin 400 MG capsule Commonly known as:  NEURONTIN Take 1 capsule (400 mg total) by mouth 2 (two) times daily.   oxyCODONE-acetaminophen 7.5-325 MG tablet Commonly known as:  PERCOCET Take 1 tablet by mouth every 6 (six) hours as needed for severe pain.   tamsulosin 0.4 MG Caps capsule Commonly known as:  FLOMAX Take 1 capsule (0.4 mg total) by mouth daily.       Allergies: No Known Allergies  Family History: Family History  Problem Relation Age of Onset  . Diabetes Father     Social History:  reports that he has quit smoking. He has never used smokeless tobacco. He reports that he drinks alcohol. He reports that he does not use drugs.  ROS: 12 point review systems is negative other than as per history of present illness.  Physical Exam: BP (!) 151/94 (BP Location: Left Arm, Patient Position: Sitting, Cuff Size: Large)   Pulse 94   Ht 6' (1.829 m)   Wt (!) 385 lb (174.6 kg)   BMI 52.22 kg/m   Constitutional:  Alert and oriented, No acute distress.  Large right leg brace  in place. Accompanied by wife and daughter today. HEENT: [place] AT, moist mucus membranes.  Trachea midline, no masses. Cardiovascular: No clubbing, cyanosis, or edema. Respiratory: Normal respiratory effort, no increased work of breathing. GI: Abdomen is soft, nontender, nondistended, no abdominal masses.  Morbidly obese abdomen. GU: No CVA tenderness.  Skin: No rashes, bruises or suspicious lesions. Neurologic: Grossly intact, no focal deficits, moving all 4 extremities. Psychiatric: Normal mood and affect.  Laboratory Data: Lab Results  Component Value Date   CREATININE 0.95 10/26/2016     Urinalysis Results for orders placed or performed in visit on 10/30/16  Microscopic Examination  Result Value Ref Range   WBC, UA 0-5 0 - 5 /hpf   RBC, UA 0-2 0 - 2 /hpf   Epithelial Cells (non renal) 0-10 0 - 10 /hpf   Bacteria, UA Few (A) None seen/Few  Urinalysis, Complete  Result Value Ref Range   Specific Gravity, UA 1.020 1.005 - 1.030   pH, UA 6.5 5.0 - 7.5   Color, UA Yellow Yellow   Appearance Ur Clear Clear   Leukocytes, UA Negative Negative   Protein, UA Negative Negative/Trace   Glucose, UA Negative Negative   Ketones, UA Negative Negative   RBC, UA Trace (A) Negative   Bilirubin, UA Negative Negative   Urobilinogen, Ur 1.0 0.2 - 1.0 mg/dL   Nitrite, UA Negative Negative   Microscopic Examination See below:     Pertinent Imaging: CLINICAL DATA:  Left flank pain hematuria  EXAM: CT ABDOMEN AND PELVIS WITHOUT CONTRAST  TECHNIQUE: Multidetector CT imaging of the abdomen and pelvis was performed following the standard protocol without oral or intravenous contrast material administration.  COMPARISON:  December 04, 2008  FINDINGS: Lower chest: Lung bases are clear.  Hepatobiliary: Liver measures 25.8 cm in length. There is evidence of a degree of hepatic steatosis. No focal liver lesions are evident on this noncontrast enhanced study. Gallbladder is absent. There is no biliary duct dilatation.  Pancreas: No pancreatic mass or inflammatory focus.  Spleen: No splenic lesions are evident. Spleen measures 15.0 x 15.6 x 8.6 cm with a measured splenic volume of 1,006 cubic cm.  Adrenals/Urinary Tract: Adrenals appear normal bilaterally. Right kidney appears normal. Left kidney is edematous. There is no renal mass on either side. There is no hydronephrosis on the right. There is moderate hydronephrosis on the left. There is a 3 mm calculus in the lower pole left kidney with a nearby 1 mm calculus. Slightly more inferiorly on the left, there is a 3 x 3  mm calculus, nonobstructing. There is also a 1 mm adjacent calculus in this area. There is a calculus in the proximal left ureter just beyond the ureteropelvic junction measuring 7 x 5 mm. There is a 2 x 1 mm calculus immediately proximal to this larger calculus in the proximal left ureter. No other ureteral calculi are identified on either side. Urinary bladder is largely decompressed. Urinary bladder wall thickness is upper normal given the degree of bladder decompression.  Stomach/Bowel: There are multiple diverticula throughout the sigmoid colon. There is localized wall thickening with surrounding edema in the mid to distal sigmoid colon, inferior to the urinary bladder. There is no frank abscess or perforation in this area. Several prominent epiploic appendages are noted in this area. There is no other evidence of bowel wall or mesenteric thickening. No bowel obstruction. No free air or portal venous air.  Vascular/Lymphatic: There is no abdominal aortic aneurysm. No vascular lesions are evident  on this noncontrast study. There is no adenopathy abdomen or pelvis.  Reproductive: Prostate and seminal vesicles appear normal in size and contour. No pelvic mass evident.  Other: Appendix appears normal. No abscess or ascites evident in the abdomen or pelvis. There is fat in the right inguinal ring. There is a small ventral hernia containing only fat.  Musculoskeletal: There are foci of degenerative change in the lower thoracic and lumbar spine regions. There are no blastic or lytic bone lesions. There is no intramuscular or abdominal wall lesion.  IMPRESSION: Moderate hydronephrosis on the left with left renal edema. There is a 7 x 5 mm calculus in the proximal left ureter slightly beyond the ureteropelvic junction. Just proximal to this calculus, there is a 2 x 1 mm calculus in the proximal left ureter as well. There are nonobstructing calculi in the lower pole left  kidney.  Probable epiploic appendagitis in the sigmoid colon region with localized wall thickening and mild mesenteric fluid/inflammation. There may be a degree of superimposed diverticulitis, although there is no focal irregular diverticulum apparent by CT. 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 There is urinary bladder wall thickening just superior to this inflammation involving the mid to distal sigmoid colon. This wall thickening may be secondary to the nearby inflammation as opposed to cystitis arising from the bladder.  There is hepatomegaly and splenomegaly of uncertain etiology. There is hepatic steatosis.  Appendix appears normal.  No abscess.  Gallbladder absent.  Small ventral hernia containing only fat.   Electronically Signed   By: Bretta Bang III M.D.   On: 10/26/2016 10:42  CT scan personally reviewed today and with the patient. Stone to skin distance nearly 20 cm.  800 HFU.    Assessment & Plan:   1. Left ureteral stone 7 mm proximal left ureteral calculus, material he passed was examined closely today and is not consistent with stone material. Given his recent episode of total flank pain, I suspect he has not yet passed the stone.  We discussed various treatment options including ESWL vs. ureteroscopy, laser lithotripsy, and stent. We discussed the risks and benefits of both including bleeding, infection, damage to surrounding structures, efficacy with need for possible further intervention, and need for temporary ureteral stent.  In addition to this, minimal expulsive therapy was discussed in detail. Given the size and location of the stone, I counseled him and he has about a 50-70% chance of passing the stone spontaneously over the next 30 days.  At this point time, he is unsure if it like to proceed with any intervention. We discussed that given his habitus, he may have a slightly slower stone clearance rate with ESWL. In addition, his  nonobstructing stones could not be treated.  He would like to think about his options and will let us know anybody to do tomorrow. We discussed that if he chooses medical expulsive therapy, but to see him back in 2 weeks with a KUB to assess the stones progress. He should continue to strain his urine. Flomax was prescribed. All questions were answered.  - Urinalysis, Complete  2. Hydronephrosis, left Secondary to #1  3. Nephrolithiasis Bilateral small nonobstructing calculi, recommend observation at this time unless he elects to proceed with ureteroscopy which time these can be addressed  Will call us tomorrow and let us know how would like to proceed.  Vanna Scotland, MD  [facility] [address] [place], [place] [address] [phone]

## 2016-10-31 ENCOUNTER — Telehealth: Payer: Self-pay

## 2016-10-31 LAB — URINALYSIS, COMPLETE
BILIRUBIN UA: NEGATIVE
Glucose, UA: NEGATIVE
Ketones, UA: NEGATIVE
Leukocytes, UA: NEGATIVE
NITRITE UA: NEGATIVE
PH UA: 6.5 (ref 5.0–7.5)
PROTEIN UA: NEGATIVE
Specific Gravity, UA: 1.02 (ref 1.005–1.030)
UUROB: 1 mg/dL (ref 0.2–1.0)

## 2016-10-31 LAB — MICROSCOPIC EXAMINATION

## 2016-10-31 NOTE — Telephone Encounter (Signed)
Spoke to patient. Confirmed Tamsulosin picked up b/c pt contacted [facility] yesterday evening stating Rx not at pharmacy. Pt requested to speak w/ surgery scheduler also. Transferred to Demetrios IsaacsAmy H., RN.

## 2016-10-31 NOTE — Telephone Encounter (Signed)
Pt states he would like to proceed with ESWL on 11/02/16. Pt will RTC to fill out ESWL paperwork prior to procedure. Questions answered to pt's satisfaction. No further questions at this time. Pt voices understanding.

## 2016-11-01 ENCOUNTER — Other Ambulatory Visit: Payer: Self-pay | Admitting: Radiology

## 2016-11-01 DIAGNOSIS — N201 Calculus of ureter: Secondary | ICD-10-CM

## 2016-11-02 ENCOUNTER — Encounter: Admission: RE | Disposition: A | Discharge status: Home / Self Care | Payer: Self-pay | Attending: Urology

## 2016-11-02 ENCOUNTER — Other Ambulatory Visit: Payer: Self-pay | Admitting: Urology

## 2016-11-02 ENCOUNTER — Ambulatory Visit: Payer: Medicaid Other | Admitting: Anesthesiology

## 2016-11-02 ENCOUNTER — Ambulatory Visit: Admit: 2016-11-02 | Payer: Self-pay | Admitting: Urology

## 2016-11-02 ENCOUNTER — Ambulatory Visit: Payer: Medicaid Other

## 2016-11-02 ENCOUNTER — Ambulatory Visit
Admission: RE | Admit: 2016-11-02 | Discharge: 2016-11-02 | Disposition: A | Discharge status: Home / Self Care | Payer: Medicaid Other | Attending: Urology | Admitting: Urology

## 2016-11-02 ENCOUNTER — Encounter: Payer: Self-pay | Admitting: *Deleted

## 2016-11-02 DIAGNOSIS — K439 Ventral hernia without obstruction or gangrene: Secondary | ICD-10-CM | POA: Diagnosis not present

## 2016-11-02 DIAGNOSIS — Z87891 Personal history of nicotine dependence: Secondary | ICD-10-CM | POA: Diagnosis not present

## 2016-11-02 DIAGNOSIS — N201 Calculus of ureter: Secondary | ICD-10-CM

## 2016-11-02 DIAGNOSIS — Z6841 Body Mass Index (BMI) 40.0 and over, adult: Secondary | ICD-10-CM | POA: Insufficient documentation

## 2016-11-02 DIAGNOSIS — Z87442 Personal history of urinary calculi: Secondary | ICD-10-CM | POA: Diagnosis present

## 2016-11-02 DIAGNOSIS — N132 Hydronephrosis with renal and ureteral calculous obstruction: Secondary | ICD-10-CM | POA: Diagnosis not present

## 2016-11-02 DIAGNOSIS — Z79899 Other long term (current) drug therapy: Secondary | ICD-10-CM | POA: Insufficient documentation

## 2016-11-02 DIAGNOSIS — Z9049 Acquired absence of other specified parts of digestive tract: Secondary | ICD-10-CM | POA: Diagnosis not present

## 2016-11-02 DIAGNOSIS — K760 Fatty (change of) liver, not elsewhere classified: Secondary | ICD-10-CM | POA: Diagnosis not present

## 2016-11-02 DIAGNOSIS — R7303 Prediabetes: Secondary | ICD-10-CM | POA: Insufficient documentation

## 2016-11-02 HISTORY — PX: URETEROSCOPY WITH HOLMIUM LASER LITHOTRIPSY: SHX6645

## 2016-11-02 HISTORY — PX: CYSTOSCOPY WITH STENT PLACEMENT: SHX5790

## 2016-11-02 HISTORY — PX: EXTRACORPOREAL SHOCK WAVE LITHOTRIPSY: SHX1557

## 2016-11-02 SURGERY — URETEROSCOPY, WITH LITHOTRIPSY USING HOLMIUM LASER
Anesthesia: General | Site: Ureter | Laterality: Left | Wound class: Clean Contaminated

## 2016-11-02 SURGERY — LITHOTRIPSY, ESWL
Anesthesia: Moderate Sedation | Laterality: Left

## 2016-11-02 MED ORDER — DIPHENHYDRAMINE HCL 25 MG PO CAPS
25.0000 mg | ORAL_CAPSULE | ORAL | Status: AC
  Administered 2016-11-02: 25 mg via ORAL

## 2016-11-02 MED ORDER — DIAZEPAM 5 MG PO TABS
ORAL_TABLET | ORAL | Status: AC
  Administered 2016-11-02: 10 mg via ORAL
  Filled 2016-11-02: qty 2

## 2016-11-02 MED ORDER — SUCCINYLCHOLINE CHLORIDE 20 MG/ML IJ SOLN
INTRAMUSCULAR | Status: DC | PRN
  Administered 2016-11-02: 160 mg via INTRAVENOUS

## 2016-11-02 MED ORDER — ONDANSETRON HCL 4 MG/2ML IJ SOLN: 4.0000 mg | Freq: Once | INTRAMUSCULAR | Status: DC | PRN

## 2016-11-02 MED ORDER — LIDOCAINE HCL (PF) 2 % IJ SOLN
INTRAMUSCULAR | Status: AC
  Filled 2016-11-02: qty 2

## 2016-11-02 MED ORDER — FENTANYL CITRATE (PF) 100 MCG/2ML IJ SOLN
INTRAMUSCULAR | Status: AC
  Filled 2016-11-02: qty 2

## 2016-11-02 MED ORDER — CIPROFLOXACIN HCL 500 MG PO TABS
ORAL_TABLET | ORAL | Status: AC
  Administered 2016-11-02: 500 mg via ORAL
  Filled 2016-11-02: qty 1

## 2016-11-02 MED ORDER — ONDANSETRON HCL 4 MG/2ML IJ SOLN
INTRAMUSCULAR | Status: AC
  Filled 2016-11-02: qty 2

## 2016-11-02 MED ORDER — DEXTROSE 5 % IV SOLN
INTRAVENOUS | Status: DC | PRN
  Administered 2016-11-02: 3 g via INTRAVENOUS

## 2016-11-02 MED ORDER — MIDAZOLAM HCL 2 MG/2ML IJ SOLN
INTRAMUSCULAR | Status: AC
  Filled 2016-11-02: qty 2

## 2016-11-02 MED ORDER — DEXTROSE-NACL 5-0.45 % IV SOLN
INTRAVENOUS | Status: DC
  Administered 2016-11-02: 08:00:00 via INTRAVENOUS

## 2016-11-02 MED ORDER — LIDOCAINE HCL (CARDIAC) 20 MG/ML IV SOLN
INTRAVENOUS | Status: DC | PRN
  Administered 2016-11-02: 100 mg via INTRAVENOUS

## 2016-11-02 MED ORDER — IOTHALAMATE MEGLUMINE 43 % IV SOLN
INTRAVENOUS | Status: DC | PRN
  Administered 2016-11-02: 15 mL via URETHRAL

## 2016-11-02 MED ORDER — FENTANYL CITRATE (PF) 100 MCG/2ML IJ SOLN
25.0000 ug | INTRAMUSCULAR | Status: DC | PRN
  Administered 2016-11-02 (×4): 25 ug via INTRAVENOUS

## 2016-11-02 MED ORDER — MIDAZOLAM HCL 2 MG/2ML IJ SOLN
INTRAMUSCULAR | Status: DC | PRN
  Administered 2016-11-02: 2 mg via INTRAVENOUS

## 2016-11-02 MED ORDER — SUCCINYLCHOLINE CHLORIDE 20 MG/ML IJ SOLN
INTRAMUSCULAR | Status: AC
  Filled 2016-11-02: qty 1

## 2016-11-02 MED ORDER — OXYCODONE-ACETAMINOPHEN 7.5-325 MG PO TABS
1.0000 | ORAL_TABLET | Freq: Four times a day (QID) | ORAL | Status: DC | PRN
  Administered 2016-11-02: 1 via ORAL

## 2016-11-02 MED ORDER — OXYCODONE-ACETAMINOPHEN 7.5-325 MG PO TABS
ORAL_TABLET | ORAL | Status: AC
  Filled 2016-11-02: qty 1

## 2016-11-02 MED ORDER — ROCURONIUM BROMIDE 50 MG/5ML IV SOLN
INTRAVENOUS | Status: AC
  Filled 2016-11-02: qty 1

## 2016-11-02 MED ORDER — PROPOFOL 10 MG/ML IV BOLUS
INTRAVENOUS | Status: AC
  Filled 2016-11-02: qty 40

## 2016-11-02 MED ORDER — FENTANYL CITRATE (PF) 100 MCG/2ML IJ SOLN
INTRAMUSCULAR | Status: DC | PRN
Start: 2016-11-02 — End: 2016-11-02
  Administered 2016-11-02 (×4): 50 ug via INTRAVENOUS

## 2016-11-02 MED ORDER — DIAZEPAM 5 MG PO TABS
10.0000 mg | ORAL_TABLET | ORAL | Status: AC
Start: 2016-11-02 — End: 2016-11-02
  Administered 2016-11-02: 10 mg via ORAL

## 2016-11-02 MED ORDER — ONDANSETRON HCL 4 MG/2ML IJ SOLN
INTRAMUSCULAR | Status: DC | PRN
  Administered 2016-11-02: 4 mg via INTRAVENOUS

## 2016-11-02 MED ORDER — OXYCODONE-ACETAMINOPHEN 7.5-325 MG PO TABS: 1.0000 | ORAL_TABLET | Freq: Four times a day (QID) | ORAL | 0 refills | Status: DC | PRN

## 2016-11-02 MED ORDER — FENTANYL CITRATE (PF) 100 MCG/2ML IJ SOLN
INTRAMUSCULAR | Status: AC
  Administered 2016-11-02: 25 ug via INTRAVENOUS
  Filled 2016-11-02: qty 2

## 2016-11-02 MED ORDER — PROPOFOL 10 MG/ML IV BOLUS
INTRAVENOUS | Status: DC | PRN
Start: 2016-11-02 — End: 2016-11-02
  Administered 2016-11-02: 250 mg via INTRAVENOUS
  Administered 2016-11-02: 50 mg via INTRAVENOUS

## 2016-11-02 MED ORDER — ROCURONIUM BROMIDE 100 MG/10ML IV SOLN
INTRAVENOUS | Status: DC | PRN
  Administered 2016-11-02: 5 mg via INTRAVENOUS

## 2016-11-02 MED ORDER — CIPROFLOXACIN HCL 500 MG PO TABS
500.0000 mg | ORAL_TABLET | ORAL | Status: AC
  Administered 2016-11-02: 500 mg via ORAL

## 2016-11-02 MED ORDER — DIPHENHYDRAMINE HCL 25 MG PO CAPS
ORAL_CAPSULE | ORAL | Status: AC
  Administered 2016-11-02: 25 mg via ORAL
  Filled 2016-11-02: qty 1

## 2016-11-02 SURGICAL SUPPLY — 30 items
ADHESIVE MASTISOL STRL (MISCELLANEOUS) ×3 IMPLANT
BAG DRAIN CYSTO-URO LG1000N (MISCELLANEOUS) ×3 IMPLANT
BASKET ZERO TIP 1.9FR (BASKET) IMPLANT
CATH URETL 5X70 OPEN END (CATHETERS) ×3 IMPLANT
CNTNR SPEC 2.5X3XGRAD LEK (MISCELLANEOUS)
CONRAY 43 FOR UROLOGY 50M (MISCELLANEOUS) ×3 IMPLANT
CONT SPEC 4OZ STER OR WHT (MISCELLANEOUS)
CONTAINER SPEC 2.5X3XGRAD LEK (MISCELLANEOUS) IMPLANT
DRAPE UTILITY 15X26 TOWEL STRL (DRAPES) ×3 IMPLANT
DRSG TEGADERM 2-3/8X2-3/4 SM (GAUZE/BANDAGES/DRESSINGS) ×3 IMPLANT
FIBER LASER LITHO 273 (Laser) ×3 IMPLANT
GLOVE BIO SURGEON STRL SZ 6.5 (GLOVE) ×2 IMPLANT
GLOVE BIO SURGEONS STRL SZ 6.5 (GLOVE) ×1
GOWN STRL REUS W/ TWL LRG LVL3 (GOWN DISPOSABLE) ×2 IMPLANT
GOWN STRL REUS W/TWL LRG LVL3 (GOWN DISPOSABLE) ×4
GUIDEWIRE GREEN .038 145CM (MISCELLANEOUS) ×3 IMPLANT
INFUSOR MANOMETER BAG 3000ML (MISCELLANEOUS) ×3 IMPLANT
INTRODUCER DILATOR DOUBLE (INTRODUCER) IMPLANT
KIT RM TURNOVER CYSTO AR (KITS) ×3 IMPLANT
PACK CYSTO AR (MISCELLANEOUS) ×3 IMPLANT
SENSORWIRE 0.038 NOT ANGLED (WIRE) ×3
SET CYSTO W/LG BORE CLAMP LF (SET/KITS/TRAYS/PACK) ×3 IMPLANT
SHEATH URETERAL 12FR 45CM (SHEATH) ×3 IMPLANT
SHEATH URETERAL 12FRX35CM (MISCELLANEOUS) IMPLANT
SOL .9 NS 3000ML IRR  AL (IV SOLUTION) ×2
SOL .9 NS 3000ML IRR UROMATIC (IV SOLUTION) ×1 IMPLANT
STENT URET 6FRX26 CONTOUR (STENTS) ×3 IMPLANT
SURGILUBE 2OZ TUBE FLIPTOP (MISCELLANEOUS) ×3 IMPLANT
WATER STERILE IRR 1000ML POUR (IV SOLUTION) ×3 IMPLANT
WIRE SENSOR 0.038 NOT ANGLED (WIRE) ×1 IMPLANT

## 2016-11-02 NOTE — Anesthesia Postprocedure Evaluation (Signed)
Anesthesia Post Note  Patient: Wesley Meyer  Procedure(s) Performed: Procedure(s) (LRB): URETEROSCOPY WITH HOLMIUM LASER LITHOTRIPSY (Left) CYSTOSCOPY WITH STENT PLACEMENT (Left)  Patient location during evaluation: PACU Anesthesia Type: General Level of consciousness: awake Pain management: pain level controlled Vital Signs Assessment: post-procedure vital signs reviewed and stable Respiratory status: spontaneous breathing Cardiovascular status: stable Anesthetic complications: no     Last Vitals:  Vitals:   11/02/16 1155 11/02/16 1157  BP: (!) 150/83 (!) 150/83  Pulse: 95 97  Resp: (!) 29 (!) 24  Temp: 36.5 C     Last Pain:  Vitals:   11/02/16 0741  TempSrc: Oral  PainSc: 6                  VAN STAVEREN,Clarene Curran

## 2016-11-02 NOTE — Interval H&P Note (Signed)
History and Physical Interval Note:  11/02/2016 2:54 PM  Wesley Meyer  has presented today for surgery, with the diagnosis of kidney stone  The various methods of treatment have been discussed with the patient and family. After consideration of risks, benefits and other options for treatment, the patient has consented to  Procedure(s): EXTRACORPOREAL SHOCK WAVE LITHOTRIPSY (ESWL) (Left) as a surgical intervention .  The patient's history has been reviewed, patient examined, no change in status, stable for surgery.  I have reviewed the patient's chart and labs.  Questions were answered to the patient's satisfaction.    RRR CTAB  Vanna ScotlandAshley Debroah Shuttleworth

## 2016-11-02 NOTE — Transfer of Care (Signed)
Immediate Anesthesia Transfer of Care Note  Patient: Debbe BalesGilberto Manjarrez  Procedure(s) Performed: Procedure(s): URETEROSCOPY WITH HOLMIUM LASER LITHOTRIPSY (Left) CYSTOSCOPY WITH STENT PLACEMENT (Left)  Patient Location: PACU  Anesthesia Type:General  Level of Consciousness: sedated and responds to stimulation  Airway & Oxygen Therapy: Patient Spontanous Breathing and Patient connected to face mask oxygen  Post-op Assessment: Report given to RN and Post -op Vital signs reviewed and stable  Post vital signs: Reviewed and stable  Last Vitals:  Vitals:   11/02/16 1155 11/02/16 1157  BP: (!) 150/83 (!) 150/83  Pulse: 95 97  Resp: (!) 29 (!) 24  Temp: 36.5 C     Last Pain:  Vitals:   11/02/16 0741  TempSrc: Oral  PainSc: 6       Patients Stated Pain Goal: 2 (11/02/16 0741)  Complications: No apparent anesthesia complications

## 2016-11-02 NOTE — Op Note (Signed)
Date of procedure: 11/02/16  Preoperative diagnosis:  1. Left obstructing ureteral stone 2. Left nonobstructing calculus   Postoperative diagnosis:  1. Same as above   Procedure: 1. Left ureteroscopy 2. Laser lithotripsy 3. Basket extraction of Stone fragment 4. Left retrograde pyelogram 5. Left ureteral stent placement  Surgeon: Vanna Scotland, MD  Anesthesia: General  Complications: None  Intraoperative findings: 7 mm stone in the proximal ureter treated. The scope was then advanced to the lower pole were nonobstructing calculi with addressed.  EBL: Minimal  Drains: 6 x 26 French double-J ureteral stent on left, string left in place  Specimen: Stone fragment  Indication: Wesley Meyer is a 31 y.o. patient with a 7 mm obstructing left proximal ureteral stone as well as stone projecting left calculi. Earlier today, he was taken out to the lithotripsy truck for attempted shockwave lithotripsy. Unfortunately, due to his habitus, the procedure was unable to be performed. After return to the recovery room, an extensive conversation between the patient, his wife, and myself was undertaken to discuss options. One option included proceeding with ureteroscopy which had been previously discussed in the office. Surgery could be accommodated today. After careful consideration, both he and his wife agreed to this procedure. We have discussed the potential benefits and risks of the procedure, side effects of the proposed treatment, the likelihood of the patient achieving the goals of the procedure, and any potential problems that might occur during the procedure or recuperation. Informed consent has been obtained.  Description of procedure:  The patient was taken to the operating room and general anesthesia was induced.  The patient was placed in the dorsal lithotomy position, prepped and draped in the usual sterile fashion, and preoperative antibiotics were administered.  Due to a brace  and inability to bend his right leg, this was carefully padded and the yellowfin as well as a thigh support rest were used to allow his leg to remain straight throughout the procedure without any injury to pressure points or nerves. A preoperative time-out was performed.   A 21 French scope was advanced per urethra into the bladder.  A scout films revealed the 7 mm proximal ureteral stone and unchanged position. There is also left lower pole calculi. Attention was then turned to left ureteral orifice which was intubated using a 5 [place] open-ended ureteral catheter. A gentle retrograde pyelogram was performed which revealed no filling defects and a decompressed ureter up to level of stone. There is a slight transition point at the level of the ureter with mild hydronephrosis. A sensor wire was then advanced alongside of the stone into the renal pelvis without difficulty. A second Super Stiff wire was then advanced up   up to this level as well. A 7 French single channel flexible ureteroscope was then advanced over the Super Stiff wire up to level of the proximal ureter to the stone was located. The wire was withdrawn. The stone was easily visualized. A 273  laser fiber was then used using the settings of 0.8 J and 10 Hz to fragment the stone into approximately 10 or so smaller pieces. Several of these pieces a reflux back into the renal pelvis into a midpole calyx. These were chased up to level of the kidney. The laser fiber was then used to fragment these into much smaller pieces. The settings of the laser were adjusted several times to optimize fragmentation. Finally, a stone within the lower pole was encountered and treated. In order to facilitate basketing of the  fragments, the Super Stiff wire was replaced.  A Super Stiff wire was placed through the scope and the scope was removed. A Cook 12/14 access sheath was advanced to the proximal ureter which advanced easily. The inner cannula was removed. The scope  was then advanced up to the kidney and each of the reasonably large fragments were evacuated from the kidney. At the completion of the procedure, no significant stone material remained. A final retrograde pyelogram was performed which showed no extravasation and outlined the kidney is a roadmap into helped facilitate the final stent placement. The scope was then backed down the length of the ureter removing the access sheath along the way. No additional ureteral stone fragments were identified. There was some mild edema and narrowing with the stone up in previously lodged within the proximal ureter, otherwise ureter was unremarkable. The safety wire was then backloaded over a rigid cystoscope. A 6 x 26 French double-J ureteral stent was advanced over the wire up to level of the renal pelvis. The wire was partially drawn until full coil was noted in the renal pelvis. The wire was then fully withdrawn and a full coil was noted within the bladder. The bladder was then drained. The string was left affixed to the distal coil of the stent. This was taped to the patient's glans using Mastisol and Tegaderm and his foreskin was replaced in normal anatomic position. He was then cleaned and dried, repositioned the supine position, reversed from anesthesia, taken to the PACU in stable condition.  Plan: Patient will remove his own stent on Monday. He'll follow-up in 4 weeks with a renal ultrasound. Findings were discussed with the patient's wife.  Vanna ScotlandAshley Xariah Silvernail, M.D.

## 2016-11-02 NOTE — Anesthesia Post-op Follow-up Note (Cosign Needed)
Anesthesia QCDR form completed.        

## 2016-11-02 NOTE — Anesthesia Preprocedure Evaluation (Signed)
Anesthesia Evaluation  Patient identified by MRN, date of birth, ID bandGeneral Assessment Comment:Pt sedated from lithotripsy procedure.  Reviewed: Allergy & Precautions, NPO status , Patient's Chart, lab work & pertinent test results  History of Anesthesia Complications Negative for: history of anesthetic complications  Airway Mallampati: III       Dental  (+) Poor Dentition, Chipped, Missing   Pulmonary neg pulmonary ROS, former smoker,           Cardiovascular negative cardio ROS       Neuro/Psych negative neurological ROS     GI/Hepatic negative GI ROS, Neg liver ROS,   Endo/Other  Morbid obesity  Renal/GU negative Renal ROS     Musculoskeletal   Abdominal   Peds  Hematology   Anesthesia Other Findings   Reproductive/Obstetrics                             Anesthesia Physical Anesthesia Plan  ASA: III  Anesthesia Plan: General   Post-op Pain Management:    Induction: Intravenous  PONV Risk Score and Plan: 3 and Ondansetron, Dexamethasone, Propofol, Midazolam and Treatment may vary due to age  Airway Management Planned:   Additional Equipment:   Intra-op Plan:   Post-operative Plan:   Informed Consent: I have reviewed the patients History and Physical, chart, labs and discussed the procedure including the risks, benefits and alternatives for the proposed anesthesia with the patient or authorized representative who has indicated his/her understanding and acceptance.     Plan Discussed with:   Anesthesia Plan Comments:         Anesthesia Quick Evaluation

## 2016-11-02 NOTE — H&P (View-Only) (Signed)
10/30/2016 7:42 AM   Debbe BalesGilberto Manjarrez 08/17/1985 [id number]  Referring provider: No referring provider defined for this encounter.  Chief Complaint  Patient presents with  . Nephrolithiasis    HPI: 31 year old male with a personal history of nephrolithiasis who presented to the emergency room on 10/26/2016 with acute onset severe left flank pain. He is found to have a 7 x 5 mm left obstructing proximal ureteral calculus with hydronephrosis.  His pain was ultimately able to be controlled in the emergency room and is discharged home with outpatient follow-up.  Her pain for 1-2 days following his emergency room visit. A few days ago, he passed very small piece of amorphous material which he brings with him today and felt that this is likely his stone. He has had some dull left flank pain today but not severe. History is uric consistently until 2 days ago when he thought he passed a stone.  He denies any dysuria, gross hematuria, fevers, or chills. No nausea or vomiting. His UA today is completely negative.  Labs in the ER including BMP were otherwise unremarkable.  He is a personal history of kidney stones in last passed a stone approximately 6 years ago. He is passed several over his lifetime but never required any surgical intervention for these.  He recently underwent quadriceps tendon repair and has been immobilized at home since surgery. He is planning on starting physical therapy tomorrow.  PMH: Past Medical History:  Diagnosis Date  . Pre-diabetes     Surgical History: Past Surgical History:  Procedure Laterality Date  . CHOLECYSTECTOMY    . COLONOSCOPY    . QUADRICEPS TENDON REPAIR Right 09/18/2016   Procedure: REPAIR QUADRICEP TENDON;  Surgeon: Deeann SaintHoward Miller, MD;  Location: [facility] ORS;  Service: Orthopedics;  Laterality: Right;    Home Medications:  Allergies as of 10/30/2016   No Known Allergies     Medication List       Accurate as of 10/30/16 11:59 PM. Always  use your most recent med list.          bisacodyl 5 MG EC tablet Commonly known as:  DULCOLAX Take 2 tablets (10 mg total) by mouth daily as needed for moderate constipation.   docusate sodium 100 MG capsule Commonly known as:  COLACE Take 200 mg by mouth daily as needed for mild constipation.   gabapentin 400 MG capsule Commonly known as:  NEURONTIN Take 1 capsule (400 mg total) by mouth 2 (two) times daily.   oxyCODONE-acetaminophen 7.5-325 MG tablet Commonly known as:  PERCOCET Take 1 tablet by mouth every 6 (six) hours as needed for severe pain.   tamsulosin 0.4 MG Caps capsule Commonly known as:  FLOMAX Take 1 capsule (0.4 mg total) by mouth daily.       Allergies: No Known Allergies  Family History: Family History  Problem Relation Age of Onset  . Diabetes Father     Social History:  reports that he has quit smoking. He has never used smokeless tobacco. He reports that he drinks alcohol. He reports that he does not use drugs.  ROS: 12 point review systems is negative other than as per history of present illness.  Physical Exam: BP (!) 151/94 (BP Location: Left Arm, Patient Position: Sitting, Cuff Size: Large)   Pulse 94   Ht 6' (1.829 m)   Wt (!) 385 lb (174.6 kg)   BMI 52.22 kg/m   Constitutional:  Alert and oriented, No acute distress.  Large right leg brace  in place. Accompanied by wife and daughter today. HEENT: [place] AT, moist mucus membranes.  Trachea midline, no masses. Cardiovascular: No clubbing, cyanosis, or edema. Respiratory: Normal respiratory effort, no increased work of breathing. GI: Abdomen is soft, nontender, nondistended, no abdominal masses.  Morbidly obese abdomen. GU: No CVA tenderness.  Skin: No rashes, bruises or suspicious lesions. Neurologic: Grossly intact, no focal deficits, moving all 4 extremities. Psychiatric: Normal mood and affect.  Laboratory Data: Lab Results  Component Value Date   CREATININE 0.95 10/26/2016     Urinalysis Results for orders placed or performed in visit on 10/30/16  Microscopic Examination  Result Value Ref Range   WBC, UA 0-5 0 - 5 /hpf   RBC, UA 0-2 0 - 2 /hpf   Epithelial Cells (non renal) 0-10 0 - 10 /hpf   Bacteria, UA Few (A) None seen/Few  Urinalysis, Complete  Result Value Ref Range   Specific Gravity, UA 1.020 1.005 - 1.030   pH, UA 6.5 5.0 - 7.5   Color, UA Yellow Yellow   Appearance Ur Clear Clear   Leukocytes, UA Negative Negative   Protein, UA Negative Negative/Trace   Glucose, UA Negative Negative   Ketones, UA Negative Negative   RBC, UA Trace (A) Negative   Bilirubin, UA Negative Negative   Urobilinogen, Ur 1.0 0.2 - 1.0 mg/dL   Nitrite, UA Negative Negative   Microscopic Examination See below:     Pertinent Imaging: CLINICAL DATA:  Left flank pain hematuria  EXAM: CT ABDOMEN AND PELVIS WITHOUT CONTRAST  TECHNIQUE: Multidetector CT imaging of the abdomen and pelvis was performed following the standard protocol without oral or intravenous contrast material administration.  COMPARISON:  December 04, 2008  FINDINGS: Lower chest: Lung bases are clear.  Hepatobiliary: Liver measures 25.8 cm in length. There is evidence of a degree of hepatic steatosis. No focal liver lesions are evident on this noncontrast enhanced study. Gallbladder is absent. There is no biliary duct dilatation.  Pancreas: No pancreatic mass or inflammatory focus.  Spleen: No splenic lesions are evident. Spleen measures 15.0 x 15.6 x 8.6 cm with a measured splenic volume of 1,006 cubic cm.  Adrenals/Urinary Tract: Adrenals appear normal bilaterally. Right kidney appears normal. Left kidney is edematous. There is no renal mass on either side. There is no hydronephrosis on the right. There is moderate hydronephrosis on the left. There is a 3 mm calculus in the lower pole left kidney with a nearby 1 mm calculus. Slightly more inferiorly on the left, there is a 3 x 3  mm calculus, nonobstructing. There is also a 1 mm adjacent calculus in this area. There is a calculus in the proximal left ureter just beyond the ureteropelvic junction measuring 7 x 5 mm. There is a 2 x 1 mm calculus immediately proximal to this larger calculus in the proximal left ureter. No other ureteral calculi are identified on either side. Urinary bladder is largely decompressed. Urinary bladder wall thickness is upper normal given the degree of bladder decompression.  Stomach/Bowel: There are multiple diverticula throughout the sigmoid colon. There is localized wall thickening with surrounding edema in the mid to distal sigmoid colon, inferior to the urinary bladder. There is no frank abscess or perforation in this area. Several prominent epiploic appendages are noted in this area. There is no other evidence of bowel wall or mesenteric thickening. No bowel obstruction. No free air or portal venous air.  Vascular/Lymphatic: There is no abdominal aortic aneurysm. No vascular lesions are evident  on this noncontrast study. There is no adenopathy abdomen or pelvis.  Reproductive: Prostate and seminal vesicles appear normal in size and contour. No pelvic mass evident.  Other: Appendix appears normal. No abscess or ascites evident in the abdomen or pelvis. There is fat in the right inguinal ring. There is a small ventral hernia containing only fat.  Musculoskeletal: There are foci of degenerative change in the lower thoracic and lumbar spine regions. There are no blastic or lytic bone lesions. There is no intramuscular or abdominal wall lesion.  IMPRESSION: Moderate hydronephrosis on the left with left renal edema. There is a 7 x 5 mm calculus in the proximal left ureter slightly beyond the ureteropelvic junction. Just proximal to this calculus, there is a 2 x 1 mm calculus in the proximal left ureter as well. There are nonobstructing calculi in the lower pole left  kidney.  Probable epiploic appendagitis in the sigmoid colon region with localized wall thickening and mild mesenteric fluid/inflammation. There may be a degree of superimposed diverticulitis, although there is no focal irregular diverticulum apparent by CT. 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 There is urinary bladder wall thickening just superior to this inflammation involving the mid to distal sigmoid colon. This wall thickening may be secondary to the nearby inflammation as opposed to cystitis arising from the bladder.  There is hepatomegaly and splenomegaly of uncertain etiology. There is hepatic steatosis.  Appendix appears normal.  No abscess.  Gallbladder absent.  Small ventral hernia containing only fat.   Electronically Signed   By: Bretta Bang III M.D.   On: 10/26/2016 10:42  CT scan personally reviewed today and with the patient. Stone to skin distance nearly 20 cm.  800 HFU.    Assessment & Plan:   1. Left ureteral stone 7 mm proximal left ureteral calculus, material he passed was examined closely today and is not consistent with stone material. Given his recent episode of total flank pain, I suspect he has not yet passed the stone.  We discussed various treatment options including ESWL vs. ureteroscopy, laser lithotripsy, and stent. We discussed the risks and benefits of both including bleeding, infection, damage to surrounding structures, efficacy with need for possible further intervention, and need for temporary ureteral stent.  In addition to this, minimal expulsive therapy was discussed in detail. Given the size and location of the stone, I counseled him and he has about a 50-70% chance of passing the stone spontaneously over the next 30 days.  At this point time, he is unsure if it like to proceed with any intervention. We discussed that given his habitus, he may have a slightly slower stone clearance rate with ESWL. In addition, his  nonobstructing stones could not be treated.  He would like to think about his options and will let us know anybody to do tomorrow. We discussed that if he chooses medical expulsive therapy, but to see him back in 2 weeks with a KUB to assess the stones progress. He should continue to strain his urine. Flomax was prescribed. All questions were answered.  - Urinalysis, Complete  2. Hydronephrosis, left Secondary to #1  3. Nephrolithiasis Bilateral small nonobstructing calculi, recommend observation at this time unless he elects to proceed with ureteroscopy which time these can be addressed  Will call us tomorrow and let us know how would like to proceed.  Vanna Scotland, MD  [facility] [address] [place], [place] [address] [phone]

## 2016-11-02 NOTE — Anesthesia Procedure Notes (Addendum)
Procedure Name: Intubation Performed by: Lance Muss Pre-anesthesia Checklist: Patient identified, Patient being monitored, Timeout performed, Emergency Drugs available and Suction available Patient Re-evaluated:Patient Re-evaluated prior to inductionOxygen Delivery Method: Circle system utilized Preoxygenation: Pre-oxygenation with 100% oxygen Intubation Type: IV induction Ventilation: Mask ventilation without difficulty and Oral airway inserted - appropriate to patient size Laryngoscope Size: Mac and 4 Grade View: Grade II Tube type: Oral Tube size: 7.5 mm Number of attempts: 1 Airway Equipment and Method: Stylet Placement Confirmation: ETT inserted through vocal cords under direct vision,  positive ETCO2 and breath sounds checked- equal and bilateral Secured at: 22 cm Tube secured with: Tape Dental Injury: Teeth and Oropharynx as per pre-operative assessment

## 2016-11-02 NOTE — Discharge Instructions (Signed)
You have a ureteral stent in place.  This is a tube that extends from your kidney to your bladder.  This may cause urinary bleeding, burning with urination, and urinary frequency.  Please call our office or present to the ED if you develop fevers >101 or pain which is not able to be controlled with oral pain medications.  You may be given either Flomax and/ or ditropan to help with bladder spasms and stent pain in addition to pain medications.    Your stent is attached to a string.  On Monday, you will pull the string until its completely removed.    [facility] [address], [address] [place], [place] [address] [phone]   AMBULATORY SURGERY  DISCHARGE INSTRUCTIONS   1) The drugs that you were given will stay in your system until tomorrow so for the next 24 hours you should not:  A) Drive an automobile B) Make any legal decisions C) Drink any alcoholic beverage   2) You may resume regular meals tomorrow.  Today it is better to start with liquids and gradually work up to solid foods.  You may eat anything you prefer, but it is better to start with liquids, then soup and crackers, and gradually work up to solid foods.   3) Please notify your doctor immediately if you have any unusual bleeding, trouble breathing, redness and pain at the surgery site, drainage, fever, or pain not relieved by medication.    4) Additional Instructions:        Please contact your physician with any problems or Same Day Surgery at [phone], Monday through Friday 6 am to 4 pm, or [place] [facility] Main number at [phone].

## 2016-11-03 ENCOUNTER — Telehealth: Payer: Self-pay | Admitting: Urology

## 2016-11-03 NOTE — Telephone Encounter (Signed)
-----   Message from Lissa HoardSarah Michelle Watts, CMA sent at 11/02/2016  1:23 PM EDT ----- Regarding: FW: f/u 4 weeks with RUS   ----- Message ----- From: Vanna ScotlandBrandon, Ashley, MD Sent: 11/02/2016  12:08 PM To: [facility] Subject: f/u 4 weeks with RUS                           Please arrange for follow-up in 4 weeks with renal ultrasound prior

## 2016-11-03 NOTE — Telephone Encounter (Signed)
done

## 2016-11-08 ENCOUNTER — Ambulatory Visit: Payer: Medicaid Other | Attending: Specialist | Admitting: Physical Therapy

## 2016-11-08 DIAGNOSIS — X58XXXA Exposure to other specified factors, initial encounter: Secondary | ICD-10-CM | POA: Diagnosis not present

## 2016-11-08 DIAGNOSIS — R262 Difficulty in walking, not elsewhere classified: Secondary | ICD-10-CM | POA: Insufficient documentation

## 2016-11-08 DIAGNOSIS — M25561 Pain in right knee: Secondary | ICD-10-CM | POA: Insufficient documentation

## 2016-11-08 DIAGNOSIS — S76111A Strain of right quadriceps muscle, fascia and tendon, initial encounter: Secondary | ICD-10-CM | POA: Diagnosis present

## 2016-11-08 DIAGNOSIS — G8929 Other chronic pain: Secondary | ICD-10-CM | POA: Insufficient documentation

## 2016-11-08 NOTE — Patient Instructions (Signed)
Gait pattern   L knee (- 5)  into hyper extension (122 flexion)   R knee 6 degrees to 53 degrees before uncomfortable

## 2016-11-08 NOTE — Therapy (Signed)
[facility] PHYSICAL AND SPORTS MEDICINE [address] [place], [place], [address] Phone: [phone]   Fax:  [phone]  Physical Therapy Evaluation  Patient Details  Name: Wesley Meyer MRN: [id number] Date of Birth: 10/31/1985 No Data Recorded  Encounter Date: 11/08/2016      PT End of Session - 11/08/16 1035    Visit Number 1   Number of Visits 17   Date for PT Re-Evaluation 01/31/17   PT Start Time 0905   PT Stop Time 0955   PT Time Calculation (min) 50 min   Equipment Utilized During Treatment Right knee immobilizer   Activity Tolerance Patient tolerated treatment well   Behavior [facility] assessed/performed      Past Medical History:  Diagnosis Date  . Pre-diabetes     Past Surgical History:  Procedure Laterality Date  . CHOLECYSTECTOMY    . COLONOSCOPY    . CYSTOSCOPY WITH STENT PLACEMENT Left 11/02/2016   Procedure: CYSTOSCOPY WITH STENT PLACEMENT;  Surgeon: Vanna ScotlandBrandon, Ashley, MD;  Location: [facility];  Service: Urology;  Laterality: Left;  . EXTRACORPOREAL SHOCK WAVE LITHOTRIPSY Left 11/02/2016   Procedure: EXTRACORPOREAL SHOCK WAVE LITHOTRIPSY (ESWL);  Surgeon: Vanna ScotlandBrandon, Ashley, MD;  Location: [facility];  Service: Urology;  Laterality: Left;  Marland Kitchen. QUADRICEPS TENDON REPAIR Right 09/18/2016   Procedure: REPAIR QUADRICEP TENDON;  Surgeon: Deeann SaintHoward Miller, MD;  Location: [facility];  Service: Orthopedics;  Laterality: Right;  . URETEROSCOPY WITH HOLMIUM LASER LITHOTRIPSY Left 11/02/2016   Procedure: URETEROSCOPY WITH HOLMIUM LASER LITHOTRIPSY;  Surgeon: Vanna ScotlandBrandon, Ashley, MD;  Location: [facility];  Service: Urology;  Laterality: Left;    There were no vitals filed for this visit.       Subjective Assessment - 11/08/16 1035    Subjective Patient reports he was helping a neighbor trim some branches on a tree when he fell backwards and instantly had pain in his R knee. He was unable to actively straighten it. He underwent  surgical repair for a vastus medialis tendon repair on 4/23 and issued crutches and a lockout brace. He has had several orthopedic follow ups and was cleared for ROM and strengthening. Reports he has been off pain medications for several days now.    Patient is accompained by: Family member   Pertinent History Had kidney stone removal last week.    Limitations Sitting;Lifting;Standing;Walking;House hold activities   Diagnostic tests MRI confirming vastus medialis rupture    Patient Stated Goals To be able to return to work and walk normally again (would like ot join gym and lose weight).    Currently in Pain? No/denies      Gait pattern with crutch in LUE noted to have trunk flexed otherwise appropriate weightbearing. He demonstrated Trendelenburg gait without crutch, though weightbearing was appropriate.   L knee (- 5)  into hyper extension (122 flexion)   R knee 6 degrees to 53 degrees before uncomfortable   SLR x 10 with PT assistance noted to have quad lag if attempting without PT assistance in knee extension brace.   Quad sets with towel roll under ankle x 10 for 5" holds (well tolerated, good output from medial quad appreciated   Sidelying hip abduction x 10 with knee brace on, well tolerated and good strength noted.   Heel slides with towel under foot x 10 on RLE (appreciable ROM noted)   Prone knee hang with 1.5# weight x 1 minute (well tolerated)   Seated knee dangling with PT support (  initially patient let leg off too quickly, noted discomfort in medial knee (compression wrap still on) which dissipated quickly. Perform increasing ROM as tolerated with gravity assist x 3 minutes.           Objective measurements completed on examination: See above findings.                  PT Education - 11/08/16 1028    Education provided Yes   Education Details Provided protocol for patient to run by Careers adviser. Will call [facility] about referral. HEP and  discussion with surgeon about progression to hinge brace.    Person(s) Educated Patient;Spouse   Methods Explanation;Demonstration;Handout   Comprehension Verbalized understanding;Returned demonstration             PT Long Term Goals - 11/08/16 1031      PT LONG TERM GOAL #1   Title Patient will demonstrate at least 110 degrees of knee flexion to demonstrate functional ROM for ADLs.    Baseline 53 degrees    Time 12   Period Weeks   Status New     PT LONG TERM GOAL #2   Title Patient will demonstrate at least -3 degrees of knee extension to demonstrate more symmetrical knee extension for pain control in ADLs.    Baseline Lacking 6 degrees of terminal knee extension    Time 12   Period Weeks   Status New     PT LONG TERM GOAL #3   Title Patient will demonstrate good control of quadriceps through 70 degrees of knee flexion in squat to perform ADLs.    Baseline Deferred per protocol    Time 12   Period Weeks   Status New     PT LONG TERM GOAL #4   Title Patient will perform work related activities with no limitations to return to ADLs.    Baseline Unable to work currently.    Time 12   Period Weeks   Status New     PT LONG TERM GOAL #5   Title Patient will report LEFS score of greater than 55/80 to demonstrate improved tolerance for ADLs.    Baseline 31/80   Time 12   Period Weeks   Status New                Plan - 11/08/16 1045    Clinical Impression Statement Patient is a very pleasant 31 y/o male that presents roughly 7 weeks s/p vastus medialis tendon repair. He demonstrates expected severe limitation of knee flexion and demonstrates significant discrepancy with knee extension (+6 on RLE, -5 on LLE). He demonstrates expected quadriceps weakness with SLRs though no pain reported during completion. No protocol provided by surgical staff, PT left message to discuss progression of closed chain activities, and progression to hinge brace. His gait pattern is  fairly normalized with cuing to decrease trunk flexion with crutch, though significant Trendelenburg noted without crutch. Patient will require extensive rehabilitation to recover from this significant injury.    Clinical Presentation Stable   Clinical Decision Making High   Rehab Potential Good   PT Frequency 1x / week   PT Duration 12 weeks   PT Treatment/Interventions Aquatic Therapy;ADLs/Self Care Home Management;Iontophoresis 4mg /ml Dexamethasone;Moist Heat;Ultrasound;Cryotherapy;Microbiologist;Therapeutic exercise;Therapeutic activities;Stair training;Gait training;Neuromuscular re-education;Patient/family education;Compression bandaging;Passive range of motion;DME Instruction;Taping;Dry needling;Manual techniques   PT Next Visit Plan Progress per protocol    PT Home Exercise Plan Quad set with towel roll under ankle, SLRs, sidelying hip abduction,  prone knee hang with weight, seated gravity assisted knee flexion, heel slides    Consulted and Agree with Plan of Care Patient;Family member/caregiver   Family Member Consulted Wife       Patient will benefit from skilled therapeutic intervention in order to improve the following deficits and impairments:  Abnormal gait, Decreased activity tolerance, Decreased balance, Decreased knowledge of precautions, Difficulty walking, Increased edema, Decreased strength, Decreased range of motion, Decreased mobility, Pain, Decreased knowledge of use of DME  Visit Diagnosis: Chronic pain of right knee - Plan: PT plan of care cert/re-cert  Rupture of right quadriceps muscle, initial encounter - Plan: PT plan of care cert/re-cert  Difficulty in walking, not elsewhere classified - Plan: PT plan of care cert/re-cert     Problem List There are no active problems to display for this patient.  Alva Garnet PT, DPT, CSCS     11/08/2016, 10:54 AM  [facility] PHYSICAL AND SPORTS  MEDICINE [address], [place], [address] Phone: [phone]   Fax:  [phone]  Name: Wesley Meyer MRN: [id number] Date of Birth: [birth year]

## 2016-11-09 LAB — STONE ANALYSIS
CA PHOS CRY STONE QL IR: 50 %
Ca Oxalate,Dihydrate: 10 %
Ca Oxalate,Monohydr.: 40 %
Stone Weight KSTONE: 16.6 mg

## 2016-11-20 ENCOUNTER — Encounter: Payer: Self-pay | Admitting: Physical Therapy

## 2016-11-21 ENCOUNTER — Ambulatory Visit: Payer: Medicaid Other | Admitting: Physical Therapy

## 2016-11-22 ENCOUNTER — Ambulatory Visit: Payer: Medicaid Other | Admitting: Physical Therapy

## 2016-11-22 ENCOUNTER — Ambulatory Visit: Payer: Self-pay | Admitting: Urology

## 2016-11-22 DIAGNOSIS — S76111A Strain of right quadriceps muscle, fascia and tendon, initial encounter: Secondary | ICD-10-CM

## 2016-11-22 DIAGNOSIS — R262 Difficulty in walking, not elsewhere classified: Secondary | ICD-10-CM

## 2016-11-22 DIAGNOSIS — M25561 Pain in right knee: Principal | ICD-10-CM

## 2016-11-22 DIAGNOSIS — G8929 Other chronic pain: Secondary | ICD-10-CM

## 2016-11-22 NOTE — Therapy (Signed)
[facility] PHYS[facility]ORTS MEDICINE [address], [place], [address] Phone: [phone]   Fax:  [phone]  Physical Therapy Treatment  Patient Details  Name: Wesley Meyer MRN: [id number] Date of Birth: 06-23-1985 No Data Recorded  Encounter Date: 11/22/2016      PT End of Session - 11/22/16 1404    Visit Number 2   Number of Visits 17   Date for PT Re-Evaluation 01/31/17   PT Start Time 1303   PT Stop Time 1348   PT Time Calculation (min) 45 min   Equipment Utilized During Treatment Right knee immobilizer   Activity Tolerance Patient tolerated treatment well   Behavior During Therapy [facility] for tasks assessed/performed      Past Medical History:  Diagnosis Date  . Pre-diabetes     Past Surgical History:  Procedure Laterality Date  . CHOLECYSTECTOMY    . COLONOSCOPY    . CYSTOSCOPY WITH STENT PLACEMENT Left 11/02/2016   Procedure: CYSTOSCOPY WITH STENT PLACEMENT;  Surgeon: Vanna Scotland, MD;  Location: [facility];  Service: Urology;  Laterality: Left;  . EXTRACORPOREAL SHOCK WAVE LITHOTRIPSY Left 11/02/2016   Procedure: EXTRACORPOREAL SHOCK WAVE LITHOTRIPSY (ESWL);  Surgeon: Vanna Scotland, MD;  Location: [facility];  Service: Urology;  Laterality: Left;  Marland Kitchen QUADRICEPS TENDON REPAIR Right 09/18/2016   Procedure: REPAIR QUADRICEP TENDON;  Surgeon: Deeann Saint, MD;  Location: [facility];  Service: Orthopedics;  Laterality: Right;  . URETEROSCOPY WITH HOLMIUM LASER LITHOTRIPSY Left 11/02/2016   Procedure: URETEROSCOPY WITH HOLMIUM LASER LITHOTRIPSY;  Surgeon: Vanna Scotland, MD;  Location: [facility];  Service: Urology;  Laterality: Left;    There were no vitals filed for this visit.      Subjective Assessment - 11/22/16 1400    Subjective Patient reports he saw his surgeon after his last PT protocol, HEP was appropriate and surgeon dc'd crutches. He is to stay in immobilizer (lock out) for another week until follow up on 7/2.     Patient is accompained by: Family member   Pertinent History Had kidney stone removal last week.    Limitations Sitting;Lifting;Standing;Walking;House hold activities   Diagnostic tests MRI confirming vastus medialis rupture    Patient Stated Goals To be able to return to work and walk normally again (would like ot join gym and lose weight).    Currently in Pain? Other (Comment)  Discomfort with overpressure into flexion, mild pain with SLRs otherwise more feeling of pressure in the knee.      In sitting, 83 degrees of flexion with mild overpressure before sharp increase in pain.   In supine 0-83 degrees before firm end feel noted.   Patellar mobilizations medial-lateral on R side, notable for increased edema around joint line, not tender but no true end feel noted as there was an excessive amount of joint line fluid. Instructed patient on elevation and ice to reduce the amount of edema.   SLRs in supine without immobilizer, able to complete 12 repetitions with mild pain at tibial tubercle, but no quad lag evident.   Prone knee flexion through roughly 60 degrees of knee flexion x 12 repetitions, tightness but no sharp pain reported.   Mini squats x 6 with bilateral UE support through roughly 30 degrees of motion, felt weakness around his R knee/quad but no sharp pain or loss of control noted.   Weight shifting in standing with bilateral UE support x 8 repetitions -- no increase in pain, though reports feeling weakness on  RLE   Able to complete LAQs x 15 on RLE through full range with no increase in pain, good quad activity noted.                             PT Education - 11/22/16 1402    Education provided Yes   Education Details Progressed HEP as per protocol which patient ran by surgeon. Instructed patient to come in after next ortho appointment for follow up planning.    Person(s) Educated Patient   Methods Explanation;Demonstration;Handout   Comprehension  Verbalized understanding;Returned demonstration             PT Long Term Goals - 11/08/16 1031      PT LONG TERM GOAL #1   Title Patient will demonstrate at least 110 degrees of knee flexion to demonstrate functional ROM for ADLs.    Baseline 53 degrees    Time 12   Period Weeks   Status New     PT LONG TERM GOAL #2   Title Patient will demonstrate at least -3 degrees of knee extension to demonstrate more symmetrical knee extension for pain control in ADLs.    Baseline Lacking 6 degrees of terminal knee extension    Time 12   Period Weeks   Status New     PT LONG TERM GOAL #3   Title Patient will demonstrate good control of quadriceps through 70 degrees of knee flexion in squat to perform ADLs.    Baseline Deferred per protocol    Time 12   Period Weeks   Status New     PT LONG TERM GOAL #4   Title Patient will perform work related activities with no limitations to return to ADLs.    Baseline Unable to work currently.    Time 12   Period Weeks   Status New     PT LONG TERM GOAL #5   Title Patient will report LEFS score of greater than 55/80 to demonstrate improved tolerance for ADLs.    Baseline 31/80   Time 12   Period Weeks   Status New               Plan - 11/22/16 1404    Clinical Impression Statement Patient is roughly 9.5 weeks post op this date, his knee extension is now at roughly 0, no quad lag with SLRs without immobilizer, flexion to 83 degrees in supine and sitting before pain/pressure noted. He was able to perform very small ROM closed chain quadricep control exercises (weight shifting and squats) witout pain but feeling of weakness. He is progressing well, though instructed to continue working on knee flexion ROM and elevating/icing his R knee to reduce swelling to increase knee flexion ROM.    Clinical Presentation Stable   Clinical Decision Making High   Rehab Potential Good   PT Frequency 1x / week   PT Duration 12 weeks   PT  Treatment/Interventions Aquatic Therapy;ADLs/Self Care Home Management;Iontophoresis 4mg /ml Dexamethasone;Moist Heat;Ultrasound;Cryotherapy;Microbiologistlectrical Stimulation;Balance training;Therapeutic exercise;Therapeutic activities;Stair training;Gait training;Neuromuscular re-education;Patient/family education;Compression bandaging;Passive range of motion;DME Instruction;Taping;Dry needling;Manual techniques   PT Next Visit Plan Progress per protocol    PT Home Exercise Plan Quad set with towel roll under ankle, SLRs, sidelying hip abduction, prone knee hang with weight, seated gravity assisted knee flexion, heel slides    Consulted and Agree with Plan of Care Patient;Family member/caregiver   Family Member Consulted Wife       Patient  will benefit from skilled therapeutic intervention in order to improve the following deficits and impairments:  Abnormal gait, Decreased activity tolerance, Decreased balance, Decreased knowledge of precautions, Difficulty walking, Increased edema, Decreased strength, Decreased range of motion, Decreased mobility, Pain, Decreased knowledge of use of DME  Visit Diagnosis: Chronic pain of right knee  Rupture of right quadriceps muscle, initial encounter  Difficulty in walking, not elsewhere classified     Problem List There are no active problems to display for this patient.  Alva Garnet PT, DPT, CSCS    11/22/2016, 2:07 PM  [facility] PHYSICAL AND SPORTS MEDICINE [address] [place], [address] Phone: [phone]   Fax:  [phone]  Name: Wesley Meyer MRN: [id number] Date of Birth: 1985-10-14

## 2016-12-01 ENCOUNTER — Ambulatory Visit
Admission: RE | Admit: 2016-12-01 | Discharge: 2016-12-01 | Disposition: A | Discharge status: Home / Self Care | Payer: Medicaid Other | Source: Ambulatory Visit | Attending: Urology | Admitting: Urology

## 2016-12-01 DIAGNOSIS — R93422 Abnormal radiologic findings on diagnostic imaging of left kidney: Secondary | ICD-10-CM | POA: Diagnosis not present

## 2016-12-01 DIAGNOSIS — N201 Calculus of ureter: Secondary | ICD-10-CM

## 2016-12-06 ENCOUNTER — Ambulatory Visit: Payer: Medicaid Other | Attending: Specialist | Admitting: Physical Therapy

## 2016-12-06 DIAGNOSIS — G8929 Other chronic pain: Secondary | ICD-10-CM | POA: Diagnosis present

## 2016-12-06 DIAGNOSIS — R262 Difficulty in walking, not elsewhere classified: Secondary | ICD-10-CM | POA: Insufficient documentation

## 2016-12-06 DIAGNOSIS — M25561 Pain in right knee: Secondary | ICD-10-CM | POA: Insufficient documentation

## 2016-12-06 DIAGNOSIS — S76111A Strain of right quadriceps muscle, fascia and tendon, initial encounter: Secondary | ICD-10-CM | POA: Diagnosis present

## 2016-12-06 NOTE — Therapy (Signed)
[place] PHYSICAL AND SPORTS MEDICINE [address] [place], [address] Phone: [phone]   Fax:  [phone]  Physical Therapy Treatment  Patient Details  Name: Wesley Meyer MRN: [id number] Date of Birth: 01/06/1986 No Data Recorded  Encounter Date: 12/06/2016      PT End of Session - 12/06/16 1435    Visit Number 3   Number of Visits 17   Date for PT Re-Evaluation 01/31/17   PT Start Time 1400   PT Stop Time 1430   PT Time Calculation (min) 30 min   Activity Tolerance Patient tolerated treatment well   Behavior During Therapy [facility] for tasks assessed/performed      Past Medical History:  Diagnosis Date  . Pre-diabetes     Past Surgical History:  Procedure Laterality Date  . CHOLECYSTECTOMY    . COLONOSCOPY    . CYSTOSCOPY WITH STENT PLACEMENT Left 11/02/2016   Procedure: CYSTOSCOPY WITH STENT PLACEMENT;  Surgeon: Hollice Espy, MD;  Location: [facility];  Service: Urology;  Laterality: Left;  . EXTRACORPOREAL SHOCK WAVE LITHOTRIPSY Left 11/02/2016   Procedure: EXTRACORPOREAL SHOCK WAVE LITHOTRIPSY (ESWL);  Surgeon: Hollice Espy, MD;  Location: [facility];  Service: Urology;  Laterality: Left;  Marland Kitchen QUADRICEPS TENDON REPAIR Right 09/18/2016   Procedure: REPAIR QUADRICEP TENDON;  Surgeon: Earnestine Leys, MD;  Location: [facility];  Service: Orthopedics;  Laterality: Right;  . URETEROSCOPY WITH HOLMIUM LASER LITHOTRIPSY Left 11/02/2016   Procedure: URETEROSCOPY WITH HOLMIUM LASER LITHOTRIPSY;  Surgeon: Hollice Espy, MD;  Location: [facility];  Service: Urology;  Laterality: Left;    There were no vitals filed for this visit.      Subjective Assessment - 12/06/16 1433    Subjective Patient reports he has been working on his HEP especially knee flexion and is having less soreness, though still has stiffness. He bought a hinge brace which he wears regularly. Reports his MD cleared him to return to work without restrictions.     Patient is accompained by: Family member   Pertinent History Had kidney stone removal last week.    Limitations Sitting;Lifting;Standing;Walking;House hold activities   Diagnostic tests MRI confirming vastus medialis rupture    Patient Stated Goals To be able to return to work and walk normally again (would like ot join gym and lose weight).    Currently in Pain? No/denies       Gait assessed- patient tends to lock RLE into extension to reduce quadricep demand, has some additional Trendelenburg on RLE in stance. All secondary to quadricep functional weakness and functional weakness of hip abductor.   ROM 1-2 degrees of extension to 102 degrees after manual therapy -- began to have pain/tightness with overpressure.   Leg Press with 55# -- patient reported this felt good, educated patient to complete light resisted exercises to not push to his limits light at first   Attempted step ups with bilateral UE assist, attempted on plastic riser with bilateral UE assist as well and he was unable to confidently go through flexion-extension excursion   Educated patient on completing hip abduction on his home gym set up   TRX squats to chair with bilateral UE assist x 5 ( appropriate technique)   A-P mobilizations at proximal tibia x 3 bouts x 30" oscillations -- will send follow up instructions on how to complete this at home.  PT Education - 12/06/16 1434    Education provided Yes   Education Details Will send video of manual A-P mobilizations, leg press, TRX squats for HEP. Email therapist about meeting at [employer] for long term management.    Person(s) Educated Patient   Methods Explanation;Demonstration;Handout   Comprehension Verbalized understanding;Returned demonstration             PT Long Term Goals - 12/06/16 1438      PT LONG TERM GOAL #1   Title Patient will demonstrate at least 110 degrees of knee flexion to demonstrate  functional ROM for ADLs.    Baseline 53 degrees -- 102 degrees on 7/11   Time 12   Period Weeks   Status Partially Met     PT LONG TERM GOAL #2   Title Patient will demonstrate at least -3 degrees of knee extension to demonstrate more symmetrical knee extension for pain control in ADLs.    Baseline Lacking 6 degrees of terminal knee extension -- 1-2 degrees on 7/11   Time 12   Period Weeks   Status Achieved     PT LONG TERM GOAL #3   Title Patient will demonstrate good control of quadriceps through 70 degrees of knee flexion in squat to perform ADLs.    Baseline Deferred per protocol -- able to complete on TRX with HHA    Time 12   Period Weeks   Status Partially Met     PT LONG TERM GOAL #4   Title Patient will perform work related activities with no limitations to return to ADLs.    Baseline Unable to work currently.    Time 12   Period Weeks   Status On-going     PT LONG TERM GOAL #5   Title Patient will report LEFS score of greater than 55/80 to demonstrate improved tolerance for ADLs.    Baseline 31/80   Time 12   Period Weeks   Status On-going               Plan - 12/06/16 1435    Clinical Impression Statement Patient has made significant progress with flexion (up to 101 degrees today) and has been diligent with his HEP. He does demonstrate significant quadricep atrophy and functional weakness (unable to ascend a step with his RLE still even with UE support) but part of this is likely fear driven as well. He is concerned about his ability to perform work related duties, informed patient to discuss half-duty to start until his strength and ROM progresses. Will provide an exercise progression for him to functional and general quadricep strengthening for improved return to work. Given his lack of insurance coverage will recommend more exercise based approach at a gym as this is likely more cost effective at this point.    Clinical Presentation Stable   Clinical  Decision Making Moderate   Rehab Potential Good   PT Frequency 1x / week   PT Duration 12 weeks   PT Treatment/Interventions Aquatic Therapy;ADLs/Self Care Home Management;Iontophoresis 87m/ml Dexamethasone;Moist Heat;Ultrasound;Cryotherapy;ELobbyistTherapeutic exercise;Therapeutic activities;Stair training;Gait training;Neuromuscular re-education;Patient/family education;Compression bandaging;Passive range of motion;DME Instruction;Taping;Dry needling;Manual techniques   PT Next Visit Plan Progress per protocol    PT Home Exercise Plan Quad set with towel roll under ankle, SLRs, sidelying hip abduction, prone knee hang with weight, seated gravity assisted knee flexion, heel slides    Consulted and Agree with Plan of Care Patient;Family member/caregiver   Family Member Consulted Wife  Patient will benefit from skilled therapeutic intervention in order to improve the following deficits and impairments:  Abnormal gait, Decreased activity tolerance, Decreased balance, Decreased knowledge of precautions, Difficulty walking, Increased edema, Decreased strength, Decreased range of motion, Decreased mobility, Pain, Decreased knowledge of use of DME  Visit Diagnosis: Chronic pain of right knee  Rupture of right quadriceps muscle, initial encounter  Difficulty in walking, not elsewhere classified     Problem List There are no active problems to display for this patient.  Royce Macadamia PT, DPT, CSCS    12/06/2016, 2:40 PM  [facility] PHYSICAL AND SPORTS MEDICINE [address] [place], [address] Phone: [phone]   Fax:  [phone]  Name: Wesley Meyer MRN: [id number] Date of Birth: December 16, 1985

## 2016-12-15 ENCOUNTER — Ambulatory Visit: Payer: Self-pay | Admitting: Urology

## 2016-12-15 ENCOUNTER — Telehealth: Payer: Self-pay | Admitting: Urology

## 2016-12-15 NOTE — Telephone Encounter (Signed)
Patient was in no show for follow-up today. He did have a renal ultrasound which showed resolution of the swelling of his kidney. The report does show debris within his kidney which may be a small stone or blood clot.  Stone composition was calcium. If he like to discuss on how to prevent stones in the future, please have him contact her office to reschedule.   Vanna ScotlandAshley Kayd Launer, MD

## 2016-12-19 NOTE — Telephone Encounter (Signed)
Spoke to patient. Gave results and instructions. Patient verbalized understanding. Patient said he showed up to f/u appt but had the incorrect time. Appt rescheduled for 07/31

## 2016-12-26 ENCOUNTER — Encounter: Payer: Self-pay | Admitting: Urology

## 2016-12-26 ENCOUNTER — Ambulatory Visit (INDEPENDENT_AMBULATORY_CARE_PROVIDER_SITE_OTHER): Payer: Self-pay | Admitting: Urology

## 2016-12-26 VITALS — BP 141/88 | HR 78 | Ht 72.0 in | Wt 385.0 lb

## 2016-12-26 DIAGNOSIS — Z87442 Personal history of urinary calculi: Secondary | ICD-10-CM

## 2016-12-26 DIAGNOSIS — E669 Obesity, unspecified: Secondary | ICD-10-CM | POA: Insufficient documentation

## 2016-12-26 NOTE — Progress Notes (Signed)
12/26/2016 3:26 PM   Wesley Meyer Wesley Meyer 02/14/1986 [id number]  Referring provider: No referring provider defined for this encounter.  Chief Complaint  Patient presents with  . Nephrolithiasis    HPI: 31 year old male who underwent left ureteroscopy, laser lithotripsy on 11/02/2016 for 7 mm proximal ureteral stone as well as treatment of a nonobstructing left lower pole stone. He was scheduled for shockwave lithotripsy, however, due to his habitus were unable to reach the ureteral stone on the truck. He was brought up to the operating room where ureteroscopy was performed for definitive management of his stone. Stent was subsequently removed without difficulty.  Today, he has no complaints. He denies any flank pain, gross hematuria, or any other urinary symptoms.  Follow up renal ultrasound shows no residual hydronephrosis.  There is some echogenic material in the kidney likely representing stone debris vs. Clot.  Stone analysis consistent with calcium oxalate dihydrate 10%, calcium oxide monohydrate 40%, calcium phosphate 50%.  Prior to this, he does have a personal history history of kidney stones and last passed a stone 6 years ago. He's never previously required surgical intervention.  PMH: Past Medical History:  Diagnosis Date  . Pre-diabetes     Surgical History: Past Surgical History:  Procedure Laterality Date  . CHOLECYSTECTOMY    . COLONOSCOPY    . CYSTOSCOPY WITH STENT PLACEMENT Left 11/02/2016   Procedure: CYSTOSCOPY WITH STENT PLACEMENT;  Surgeon: Vanna ScotlandBrandon, Jaleeyah Munce, MD;  Location: [facility];  Service: Urology;  Laterality: Left;  . EXTRACORPOREAL SHOCK WAVE LITHOTRIPSY Left 11/02/2016   Procedure: EXTRACORPOREAL SHOCK WAVE LITHOTRIPSY (ESWL);  Surgeon: Vanna ScotlandBrandon, Brittanie Dosanjh, MD;  Location: [facility];  Service: Urology;  Laterality: Left;  Marland Kitchen. QUADRICEPS TENDON REPAIR Right 09/18/2016   Procedure: REPAIR QUADRICEP TENDON;  Surgeon: Deeann SaintHoward Miller, MD;  Location: [facility];   Service: Orthopedics;  Laterality: Right;  . URETEROSCOPY WITH HOLMIUM LASER LITHOTRIPSY Left 11/02/2016   Procedure: URETEROSCOPY WITH HOLMIUM LASER LITHOTRIPSY;  Surgeon: Vanna ScotlandBrandon, Duanne Duchesne, MD;  Location: [facility];  Service: Urology;  Laterality: Left;    Home Medications:  Allergies as of 12/26/2016   No Known Allergies     Medication List    as of 12/26/2016  3:26 PM   You have not been prescribed any medications.     Allergies: No Known Allergies  Family History: Family History  Problem Relation Age of Onset  . Diabetes Father     Social History:  reports that he has quit smoking. He has never used smokeless tobacco. He reports that he drinks alcohol. He reports that he does not use drugs.  ROS: UROLOGY Frequent Urination?: No Hard to postpone urination?: No Burning/pain with urination?: No Get up at night to urinate?: No Leakage of urine?: No Urine stream starts and stops?: No Trouble starting stream?: No Do you have to strain to urinate?: No Blood in urine?: No Urinary tract infection?: No Sexually transmitted disease?: No Injury to kidneys or bladder?: No Painful intercourse?: No Weak stream?: No Erection problems?: No Penile pain?: No  Gastrointestinal Nausea?: No Vomiting?: No Indigestion/heartburn?: No Diarrhea?: No Constipation?: No  Constitutional Fever: No Night sweats?: No Weight loss?: No Fatigue?: No  Skin Skin rash/lesions?: No Itching?: No  Eyes Blurred vision?: No Double vision?: No  Ears/Nose/Throat Sore throat?: No Sinus problems?: No  Hematologic/Lymphatic Swollen glands?: No Easy bruising?: No  Cardiovascular Leg swelling?: No Chest pain?: No  Respiratory Cough?: No Shortness of breath?: No  Endocrine Excessive thirst?: No  Musculoskeletal Back pain?: No Joint  pain?: No  Neurological Headaches?: No Dizziness?: No  Psychologic Depression?: No Anxiety?: No  Physical Exam: BP (!) 141/88   Pulse 78    Ht 6' (1.829 m)   Wt (!) 385 lb (174.6 kg)   BMI 52.22 kg/m   Constitutional:  Alert and oriented, No acute distress.  Accompanied by her daughter today. HEENT: [place] AT, moist mucus membranes.  Trachea midline, no masses. Cardiovascular: No clubbing, cyanosis, or edema. Respiratory: Normal respiratory effort, no increased work of breathing. GI: Abdomen is soft, nontender, nondistended, no abdominal masses.  Obese. GU: No CVA tenderness.  Skin: No rashes, bruises or suspicious lesions. MSK: Wearing right knee brace. Neurologic: Grossly intact, no focal deficits, moving all 4 extremities. Psychiatric: Normal mood and affect.  Laboratory Data:  Lab Results  Component Value Date   CREATININE 0.95 10/26/2016     Urinalysis N/a  Pertinent Imaging: CLINICAL DATA:  Left ureteral stone on Oct 26, 2016 removed by surgery.  EXAM: RENAL / URINARY TRACT ULTRASOUND COMPLETE  COMPARISON:  None.  FINDINGS: Right Kidney:  Length: 12.8 cm. Echogenicity within normal limits. No mass or hydronephrosis visualized.  Left Kidney:  Length: 13.2 cm. 7 mm echogenic focus in the midpole left kidney question nonobstructing stone. No mass or hydronephrosis visualized.  Bladder:  Appears normal for degree of bladder distention.  IMPRESSION: 7 mm echogenic focus in midpole left kidney question nonobstructing stone. No hydronephrosis bilaterally.   Electronically Signed   By: Sherian ReinWei-Chen  Lin M.D.   On: 12/01/2016 11:05  Renal ultrasound personally reviewed today.  Assessment & Plan:    1. History of kidney stones Renal ultrasound reviewed, no residual iatrogenic hydronephrosis- echogenic focus with the representing dusted stone debris based on very linear dependent configuration Stone analysis reviewed We discussed general stone prevention techniques including drinking plenty water with goal of producing 2.5 L urine daily, increased citric acid intake, avoidance of high  oxalate containing foods, and decreased salt intake.  Information about dietary recommendations given today.   Recommend follow-up in 6 months with KUB to ensure that he's not actively forming new stones  - DG Abd 1 View; Future  Return in about 6 months (around 06/28/2017) for KUB (shannon or me).  Vanna ScotlandAshley Leydi Winstead, MD  [facility] [address], [address] [place], [place] [address] [phone]

## 2017-02-18 ENCOUNTER — Emergency Department
Admission: EM | Admit: 2017-02-18 | Discharge: 2017-02-18 | Disposition: A | Discharge status: Home / Self Care | Payer: Medicaid Other | Attending: Emergency Medicine | Admitting: Emergency Medicine

## 2017-02-18 ENCOUNTER — Encounter: Payer: Self-pay | Admitting: Emergency Medicine

## 2017-02-18 ENCOUNTER — Emergency Department: Payer: Medicaid Other

## 2017-02-18 DIAGNOSIS — S3992XA Unspecified injury of lower back, initial encounter: Secondary | ICD-10-CM | POA: Diagnosis present

## 2017-02-18 DIAGNOSIS — S39012A Strain of muscle, fascia and tendon of lower back, initial encounter: Secondary | ICD-10-CM | POA: Diagnosis not present

## 2017-02-18 DIAGNOSIS — X501XXA Overexertion from prolonged static or awkward postures, initial encounter: Secondary | ICD-10-CM | POA: Insufficient documentation

## 2017-02-18 DIAGNOSIS — Y929 Unspecified place or not applicable: Secondary | ICD-10-CM | POA: Diagnosis not present

## 2017-02-18 DIAGNOSIS — Z87891 Personal history of nicotine dependence: Secondary | ICD-10-CM | POA: Diagnosis not present

## 2017-02-18 DIAGNOSIS — Y999 Unspecified external cause status: Secondary | ICD-10-CM | POA: Diagnosis not present

## 2017-02-18 DIAGNOSIS — M62830 Muscle spasm of back: Secondary | ICD-10-CM | POA: Diagnosis not present

## 2017-02-18 DIAGNOSIS — Y939 Activity, unspecified: Secondary | ICD-10-CM | POA: Insufficient documentation

## 2017-02-18 MED ORDER — MELOXICAM 15 MG PO TABS: 15.0000 mg | ORAL_TABLET | Freq: Every day | ORAL | 0 refills | Status: DC

## 2017-02-18 MED ORDER — ORPHENADRINE CITRATE 30 MG/ML IJ SOLN: 60.0000 mg | Freq: Once | INTRAMUSCULAR | Status: DC

## 2017-02-18 MED ORDER — METHOCARBAMOL 1000 MG/10ML IJ SOLN
1000.0000 mg | Freq: Once | INTRAMUSCULAR | Status: AC
  Administered 2017-02-18: 1000 mg via INTRAMUSCULAR
  Filled 2017-02-18 (×2): qty 10

## 2017-02-18 MED ORDER — METHOCARBAMOL 500 MG PO TABS: 1000.0000 mg | ORAL_TABLET | Freq: Four times a day (QID) | ORAL | 0 refills | Status: DC

## 2017-02-18 MED ORDER — KETOROLAC TROMETHAMINE 60 MG/2ML IM SOLN
60.0000 mg | Freq: Once | INTRAMUSCULAR | Status: AC
  Administered 2017-02-18: 60 mg via INTRAMUSCULAR
  Filled 2017-02-18: qty 2

## 2017-02-18 NOTE — ED Notes (Signed)
Pt c/o back pain after hearing a pop in his back.  Pt states no position is comfortable and is unable to stand up straight without pain.

## 2017-02-18 NOTE — ED Triage Notes (Signed)
Pt c/o severe lower back pain after going to stand up from couch and heard something pop in lower back.  Pain worse with movement.  No loss bowel or bladder.

## 2017-02-18 NOTE — ED Notes (Signed)
Pt transported to XR.  

## 2017-02-18 NOTE — ED Provider Notes (Signed)
[facility] Emergency Department Provider Note  ____________________________________________  Time seen: Approximately 3:16 PM  I have reviewed the triage vital signs and the nursing notes.   HISTORY  Chief Complaint Back Pain    HPI Wesley Meyer is a 31 y.o. male who presents emergency department complaining of right-sided lower back pain. Patient states that he tried to stand up earlier today, felt a sharp pop, had right-sided lower back pain. Patient initially tried to walk at all but states that area is very painful. He went to a "friend" who tried manipulating his back. Patient reports that this made his symptoms worse. patient denies any bowel or bladder dysfunction, saddle anesthesia, paresthesias. No Medications prior to arrival. No other complaints.   Past Medical History:  Diagnosis Date  . Pre-diabetes     Patient Active Problem List   Diagnosis Date Noted  . Obesity 12/26/2016  . Rupture of quadriceps tendon 09/29/2016  . Strain of right quadriceps muscle 09/13/2016    Past Surgical History:  Procedure Laterality Date  . CHOLECYSTECTOMY    . COLONOSCOPY    . CYSTOSCOPY WITH STENT PLACEMENT Left 11/02/2016   Procedure: CYSTOSCOPY WITH STENT PLACEMENT;  Surgeon: Vanna Scotland, MD;  Location: [facility];  Service: Urology;  Laterality: Left;  . EXTRACORPOREAL SHOCK WAVE LITHOTRIPSY Left 11/02/2016   Procedure: EXTRACORPOREAL SHOCK WAVE LITHOTRIPSY (ESWL);  Surgeon: Vanna Scotland, MD;  Location: [facility];  Service: Urology;  Laterality: Left;  Marland Kitchen QUADRICEPS TENDON REPAIR Right 09/18/2016   Procedure: REPAIR QUADRICEP TENDON;  Surgeon: Deeann Saint, MD;  Location: [facility];  Service: Orthopedics;  Laterality: Right;  . URETEROSCOPY WITH HOLMIUM LASER LITHOTRIPSY Left 11/02/2016   Procedure: URETEROSCOPY WITH HOLMIUM LASER LITHOTRIPSY;  Surgeon: Vanna Scotland, MD;  Location: [facility];  Service: Urology;  Laterality: Left;    Prior to  Admission medications   Medication Sig Start Date End Date Taking? Authorizing Provider  meloxicam (MOBIC) 15 MG tablet Take 1 tablet (15 mg total) by mouth daily. 02/18/17   Cuthriell, Delorise Royals, PA-C  methocarbamol (ROBAXIN) 500 MG tablet Take 2 tablets (1,000 mg total) by mouth 4 (four) times daily. 02/18/17   Cuthriell, Delorise Royals, PA-C    Allergies Patient has no known allergies.  Family History  Problem Relation Age of Onset  . Diabetes Father     Social History Social History  Substance Use Topics  . Smoking status: Former Games developer  . Smokeless tobacco: Never Used  . Alcohol use Yes     Comment: occassional     Review of Systems  Constitutional: No fever/chills Eyes: No visual changes.  Cardiovascular: no chest pain. Respiratory: no cough. No SOB. Gastrointestinal: No abdominal pain.  No nausea, no vomiting.   Musculoskeletal: positive for right-sided lower back pain Skin: Negative for rash, abrasions, lacerations, ecchymosis. Neurological: Negative for headaches, focal weakness or numbness. 10-point ROS otherwise negative.  ____________________________________________   PHYSICAL EXAM:  VITAL SIGNS: ED Triage Vitals  Enc Vitals Group     BP 02/18/17 1458 (!) 170/89     Pulse Rate 02/18/17 1458 87     Resp 02/18/17 1458 18     Temp 02/18/17 1458 98.1 F (36.7 C)     Temp Source 02/18/17 1458 Oral     SpO2 02/18/17 1458 97 %     Weight 02/18/17 1502 (!) 385 lb (174.6 kg)     Height 02/18/17 1502 6' (1.829 m)     Head Circumference --  Peak Flow --      Pain Score 02/18/17 1501 8     Pain Loc --      Pain Edu? --      Excl. in GC? --      Constitutional: Alert and oriented. Well appearing and in no acute distress. Eyes: Conjunctivae are normal. PERRL. EOMI. Head: Atraumatic. Neck: No stridor.    Cardiovascular: Normal rate, regular rhythm. Normal S1 and S2.  Good peripheral circulation. Respiratory: Normal respiratory effort without tachypnea or  retractions. Lungs CTAB. Good air entry to the bases with no decreased or absent breath sounds. Gastrointestinal: Bowel sounds 4 quadrants. Soft and nontender to palpation. No guarding or rigidity. No palpable masses. No distention. No CVA tenderness. Musculoskeletal: Full range of motion to all extremities. No gross deformities appreciated.no deformities to spine upon inspection. Patient very tender palpation right-sided paraspinal muscle group with no midline tenderness. No palpable abnormality or step-off. Dorsalis pedis pulse intact bilateral lower extremities. Sensation intact and equal bilateral lower extremities. Neurologic:  Normal speech and language. No gross focal neurologic deficits are appreciated.  Skin:  Skin is warm, dry and intact. No rash noted. Psychiatric: Mood and affect are normal. Speech and behavior are normal. Patient exhibits appropriate insight and judgement.   ____________________________________________   LABS (all labs ordered are listed, but only abnormal results are displayed)  Labs Reviewed - No data to display ____________________________________________  EKG   ____________________________________________  RADIOLOGY Festus Barren Cuthriell, personally viewed and evaluated these images (plain radiographs) as part of my medical decision making, as well as reviewing the written report by the radiologist.  Dg Lumbar Spine Complete  Result Date: 02/18/2017 CLINICAL DATA:  Felt a pop getting up from couch. Pain worse after seeing chiropractor. EXAM: LUMBAR SPINE - COMPLETE 4+ VIEW COMPARISON:  CT abdomen and pelvis Oct 26, 2016 FINDINGS: Five non rib-bearing lumbar-type vertebral bodies are intact and aligned with maintenance of the lumbar lordosis. Mild old T11 compression fracture. Intervertebral disc heights are normal. No destructive bony lesions. Sacroiliac joints are symmetric. Included prevertebral and paraspinal soft tissue planes are non-suspicious.  IMPRESSION: Negative. Electronically Signed   By: Awilda Metro M.D.   On: 02/18/2017 16:22    ____________________________________________    PROCEDURES  Procedure(s) performed:    Procedures    Medications  methocarbamol (ROBAXIN) injection 1,000 mg (not administered)  ketorolac (TORADOL) injection 60 mg (60 mg Intramuscular Given 02/18/17 1623)     ____________________________________________   INITIAL IMPRESSION / ASSESSMENT AND PLAN / ED COURSE  Pertinent labs & imaging results that were available during my care of the patient were reviewed by me and considered in my medical decision making (see chart for details).  Review of the  [place] was performed in accordance of the [facility] prior to dispensing any controlled drugs.     Patient's diagnosis is consistent with lumbar strain with paraspinal muscle spasms. X-ray reveals no acute osseous abnormality Exam is reassuring with no indication for further labs or imaging.. Patient will be discharged home with prescriptions for anti-inflammatory and muscle relaxer. Patient is to follow up with primary care as needed or otherwise directed. Patient is given ED precautions to return to the ED for any worsening or new symptoms.     ____________________________________________  FINAL CLINICAL IMPRESSION(S) / ED DIAGNOSES  Final diagnoses:  Strain of lumbar region, initial encounter  Muscle spasm of back      NEW MEDICATIONS STARTED DURING THIS VISIT:  New Prescriptions   MELOXICAM (  MOBIC) 15 MG TABLET    Take 1 tablet (15 mg total) by mouth daily.   METHOCARBAMOL (ROBAXIN) 500 MG TABLET    Take 2 tablets (1,000 mg total) by mouth 4 (four) times daily.        This chart was dictated using voice recognition software/Dragon. Despite best efforts to proofread, errors can occur which can change the meaning. Any change was purely unintentional.    Racheal Patches, PA-C 02/18/17 1636    Merrily Brittle,  MD 02/18/17 [phone]

## 2017-02-18 NOTE — ED Notes (Signed)
See triage and provider note 

## 2017-03-05 ENCOUNTER — Emergency Department
Admission: EM | Admit: 2017-03-05 | Discharge: 2017-03-05 | Disposition: A | Discharge status: Home / Self Care | Payer: Medicaid Other | Attending: Emergency Medicine | Admitting: Emergency Medicine

## 2017-03-05 ENCOUNTER — Encounter: Payer: Self-pay | Admitting: Emergency Medicine

## 2017-03-05 DIAGNOSIS — M5441 Lumbago with sciatica, right side: Secondary | ICD-10-CM | POA: Diagnosis not present

## 2017-03-05 DIAGNOSIS — M5431 Sciatica, right side: Secondary | ICD-10-CM

## 2017-03-05 DIAGNOSIS — Z87891 Personal history of nicotine dependence: Secondary | ICD-10-CM | POA: Insufficient documentation

## 2017-03-05 DIAGNOSIS — M5416 Radiculopathy, lumbar region: Secondary | ICD-10-CM

## 2017-03-05 DIAGNOSIS — M545 Low back pain: Secondary | ICD-10-CM | POA: Diagnosis present

## 2017-03-05 MED ORDER — DEXAMETHASONE SODIUM PHOSPHATE 10 MG/ML IJ SOLN
10.0000 mg | Freq: Once | INTRAMUSCULAR | Status: AC
  Administered 2017-03-05: 10 mg via INTRAMUSCULAR
  Filled 2017-03-05: qty 1

## 2017-03-05 MED ORDER — PREDNISONE 10 MG (21) PO TBPK: ORAL_TABLET | ORAL | 0 refills | Status: DC

## 2017-03-05 NOTE — ED Triage Notes (Signed)
Pt sent over from [facility] for pain meds for low back pain that radiates down into left leg.

## 2017-03-05 NOTE — ED Notes (Signed)
Pt presents with lower back pain. States that he was seen 3 weeks ago for pulled back muscle. He followed up with [facility] and was told he had a pinched nerve and was told it would resolve on its own. It has not, and he went back to [facility] today and was told that they would not refill prescriptions and that he needed to go to the ED. Pt states that pain goes down right leg from sacral area.

## 2017-03-05 NOTE — ED Provider Notes (Signed)
[facility] Emergency Department Provider Note   ____________________________________________   I have reviewed the triage vital signs and the nursing notes.   HISTORY  Chief Complaint Back Pain    HPI Wesley Meyer is a 30 y.o. male presents to the emergency department with lumbar back pain with radiating pain down the right lower extremity. Patient reports symptoms have been worsening over the past several days. Patient initially strained his back 3 weeks ago and he was seen in the emergency department.  During that visit he was referred to the [facility]. He was evaluated and it was found that he had a "pinched nerve". He "received medication and his symptoms did improve". Today when symptoms were significantly worse he went to the [facility]. They informed him that they could not refill his narcotic prescriptions and he would have to go to the emergency department to be treated. 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 He describes tingling sensations in the right foot and toes. Patient denies any loss of strength in the right lower extremity, bowel or bladder dysfunction or saddle anesthesia. Patient denies fever, chills, headache, vision changes, chest pain, chest tightness, shortness of breath, abdominal pain, nausea and vomiting.  Past Medical History:  Diagnosis Date  . Pre-diabetes     Patient Active Problem List   Diagnosis Date Noted  . Obesity 12/26/2016  . Rupture of quadriceps tendon 09/29/2016  . Strain of right quadriceps muscle 09/13/2016    Past Surgical History:  Procedure Laterality Date  . CHOLECYSTECTOMY    . COLONOSCOPY    . CYSTOSCOPY WITH STENT PLACEMENT Left 11/02/2016   Procedure: CYSTOSCOPY WITH STENT PLACEMENT;  Surgeon: Vanna Scotland, MD;  Location: [facility];  Service: Urology;  Laterality: Left;    . EXTRACORPOREAL SHOCK WAVE LITHOTRIPSY Left 11/02/2016   Procedure: EXTRACORPOREAL SHOCK WAVE LITHOTRIPSY (ESWL);  Surgeon: Vanna Scotland, MD;  Location: [facility];  Service: Urology;  Laterality: Left;  Marland Kitchen QUADRICEPS TENDON REPAIR Right 09/18/2016   Procedure: REPAIR QUADRICEP TENDON;  Surgeon: Deeann Saint, MD;  Location: [facility];  Service: Orthopedics;  Laterality: Right;  . URETEROSCOPY WITH HOLMIUM LASER LITHOTRIPSY Left 11/02/2016   Procedure: URETEROSCOPY WITH HOLMIUM LASER LITHOTRIPSY;  Surgeon: Vanna Scotland, MD;  Location: [facility];  Service: Urology;  Laterality: Left;    Prior to Admission medications   Medication Sig Start Date End Date Taking? Authorizing Provider  meloxicam (MOBIC) 15 MG tablet Take 1 tablet (15 mg total) by mouth daily. 02/18/17   Cuthriell, Delorise Royals, PA-C  methocarbamol (ROBAXIN) 500 MG tablet Take 2 tablets (1,000 mg total) by mouth 4 (four) times daily. 02/18/17   Cuthriell, Delorise Royals, PA-C  predniSONE (STERAPRED UNI-PAK 21 TAB) 10 MG (21) TBPK tablet Take 6 tablets on day 1. Take 5 tablets on day 2. Take 4 tablets on day 3. Take 3 tablets on day 4. Take 2 tablets on day 5. Take 1 tablets on day 6. 03/05/17   Yvonnia Tango M, PA-C    Allergies Patient has no known allergies.  Family History  Problem Relation Age of Onset  . Diabetes Father     Social History Social History  Substance Use Topics  . Smoking status: Former Games developer  . Smokeless tobacco: Never Used  . Alcohol use Yes     Comment: occassional    Review of Systems Constitutional: Negative for fever/chills  Cardiovascular: Denies chest pain. Genitourinary: Negative for dysuria. Musculoskeletal:Positive for back pain and right side radicular symptoms. Skin: Negative for rash. Neurological: Negative for headaches.  ____________________________________________   PHYSICAL EXAM:  VITAL SIGNS: ED Triage Vitals  Enc Vitals Group     BP 03/05/17 1318 (!) 148/98     Pulse Rate  03/05/17 1318 75     Resp 03/05/17 1318 20     Temp 03/05/17 1318 98 F (36.7 C)     Temp Source 03/05/17 1318 Oral     SpO2 03/05/17 1318 98 %     Weight 03/05/17 1319 (!) 385 lb (174.6 kg)     Height --      Head Circumference --      Peak Flow --      Pain Score 03/05/17 1318 10     Pain Loc --      Pain Edu? --      Excl. in GC? --     Constitutional: Alert and oriented. Well appearing and in no acute distress.  Head: Normocephalic and atraumatic. Respiratory: Normal respiratory effort without tachypnea or retractions.  Cardiovascular: Normal rate, regular rhythm. Normal distal pulses. Gastrointestinal: Bowel sounds 4 quadrants. Soft and nontender to palpation.  Musculoskeletal: Lumbar spine range of motion all planes intact. Negative spinous process tenderness noted. Mild tenderness over the paraspinal musculature. Subjective complaints of radicular pain, tingling along right lower extremity to the toes. No motor deficits noted. Intact reflexes. Neurologic: Normal speech and language.  Skin:  Skin is warm, dry and intact. No rash noted. Psychiatric: Mood and affect are normal. Speech and behavior are normal. Patient exhibits appropriate insight and judgement.  ____________________________________________   LABS (all labs ordered are listed, but only abnormal results are displayed)  Labs Reviewed - No data to display ____________________________________________  EKG None ____________________________________________  RADIOLOGY None ____________________________________________   PROCEDURES  Procedure(s) performed: no    Critical Care performed: no ____________________________________________   INITIAL IMPRESSION / ASSESSMENT AND PLAN / ED COURSE  Pertinent labs & imaging results that were available during my care of the patient were reviewed by me and considered in my medical decision making (see chart for details).  Patient presents to emergency  department with lumbar back pain with right side radiculopathy. History and physical exam findings are reassuring symptoms consistent with exacerbation of right side sciatica and likely nerve root irritation. Patient noted decreased symptoms following Decadron given during the course of care in the emergency department. Patient will be prescribed prednisone taper and advised to continue current muscle relaxers he was prescribed by his previous provider. Patient advised to follow up with PCP as needed or return to the emergency department if symptoms return or worsen. Patient informed of clinical course, understand medical decision-making process, and agree with plan. ____________________________________________   FINAL CLINICAL IMPRESSION(S) / ED DIAGNOSES  Final diagnoses:  Lumbar back pain with radiculopathy affecting right lower extremity  Sciatica of right side       NEW MEDICATIONS STARTED DURING THIS VISIT:  Discharge Medication List as of 03/05/2017  2:18 PM    START taking these medications   Details  predniSONE (STERAPRED UNI-PAK 21 TAB) 10 MG (21) TBPK tablet Take 6 tablets on day 1. Take 5 tablets on day 2. Take 4 tablets on day 3. Take 3 tablets on day 4. Take 2 tablets on day 5. Take 1 tablets on day 6., Print         Note:  This document was prepared  using Conservation officer, historic buildings and may include unintentional dictation errors.   Percell Boston 03/05/17 2012    Minna Antis, MD 03/06/17 1640

## 2017-03-05 NOTE — ED Notes (Signed)
Pt discharged to home.  Family member driving.  Discharge instructions reviewed.  Verbalized understanding.  No questions or concerns at this time.  Teach back verified.  Pt in NAD.  No items left in ED.   

## 2017-03-05 NOTE — Discharge Instructions (Signed)
Take medication as prescribed. Return to emergency department if symptoms worsen and follow-up with PCP as needed.   °

## 2017-06-28 ENCOUNTER — Ambulatory Visit: Payer: Self-pay | Admitting: Urology

## 2017-07-10 ENCOUNTER — Ambulatory Visit: Payer: Self-pay | Admitting: Urology

## 2017-07-24 ENCOUNTER — Ambulatory Visit: Payer: Self-pay | Admitting: Urology

## 2017-07-24 ENCOUNTER — Encounter: Payer: Self-pay | Admitting: Urology

## 2018-05-24 ENCOUNTER — Emergency Department
Admission: EM | Admit: 2018-05-24 | Discharge: 2018-05-24 | Disposition: A | Discharge status: Home / Self Care | Payer: No Typology Code available for payment source | Attending: Emergency Medicine | Admitting: Emergency Medicine

## 2018-05-24 ENCOUNTER — Emergency Department: Payer: No Typology Code available for payment source

## 2018-05-24 ENCOUNTER — Other Ambulatory Visit: Payer: Self-pay

## 2018-05-24 DIAGNOSIS — M25561 Pain in right knee: Secondary | ICD-10-CM | POA: Insufficient documentation

## 2018-05-24 DIAGNOSIS — Z87891 Personal history of nicotine dependence: Secondary | ICD-10-CM | POA: Diagnosis not present

## 2018-05-24 MED ORDER — TRAMADOL HCL 50 MG PO TABS: 50.0000 mg | ORAL_TABLET | Freq: Four times a day (QID) | ORAL | 0 refills | Status: AC | PRN

## 2018-05-24 MED ORDER — MELOXICAM 15 MG PO TABS: 15.0000 mg | ORAL_TABLET | Freq: Every day | ORAL | 0 refills | Status: AC

## 2018-05-24 NOTE — ED Triage Notes (Signed)
Pt states that he had surg to the rt knee a few years ago, states that he recently fell on the rt knee injuring it again states that he had an mri and it showed a tear to the acl. Pt states that he started rehabbing it again and it was doing better, pt states that his knee collapsed again on him Sunday causing him to fall on it again, states that he was unable to walk again  For a few days and states that the swelling was bad but has started to get some better, pt states that he has intermittent sharp severe pain s through the knee, pt states that  The original injury is a worker's comp case and he contacted the case worker prior to coming

## 2018-05-24 NOTE — ED Provider Notes (Signed)
[facility] Emergency Department Provider Note  ____________________________________________  Time seen: Approximately 8:46 PM  I have reviewed the triage vital signs and the nursing notes.   HISTORY  Chief Complaint Knee Pain    HPI Wesley Meyer is a 32 y.o. male presents to the emergency department with acute on chronic right knee pain.  Patient reports that he had a partial ACL tear and then experienced a fall approximately 5 days ago.  Patient reported right knee swelling and pain that has since progressively improved.  Patient states it is still difficult for him to bear weight and pain is significant enough that he cannot return to work.  No numbness or tingling in the upper extremities.  Patient reports that he was told that if he has knee pain he should come to the emergency department.  Patient's right knee pain is being managed by Dr. Emmit PomfretStephen Noris with [facility].  No alleviating measures have been attempted.   Past Medical History:  Diagnosis Date  . Pre-diabetes     Patient Active Problem List   Diagnosis Date Noted  . Obesity 12/26/2016  . Rupture of quadriceps tendon 09/29/2016  . Strain of right quadriceps muscle 09/13/2016    Past Surgical History:  Procedure Laterality Date  . CHOLECYSTECTOMY    . COLONOSCOPY    . CYSTOSCOPY WITH STENT PLACEMENT Left 11/02/2016   Procedure: CYSTOSCOPY WITH STENT PLACEMENT;  Surgeon: Vanna ScotlandBrandon, Ashley, MD;  Location: [facility] ORS;  Service: Urology;  Laterality: Left;  . EXTRACORPOREAL SHOCK WAVE LITHOTRIPSY Left 11/02/2016   Procedure: EXTRACORPOREAL SHOCK WAVE LITHOTRIPSY (ESWL);  Surgeon: Vanna ScotlandBrandon, Ashley, MD;  Location: [facility] ORS;  Service: Urology;  Laterality: Left;  Marland Kitchen. QUADRICEPS TENDON REPAIR Right 09/18/2016   Procedure: REPAIR QUADRICEP TENDON;  Surgeon: Deeann SaintHoward Miller, MD;  Location: [facility] ORS;  Service: Orthopedics;  Laterality: Right;  . URETEROSCOPY WITH HOLMIUM LASER LITHOTRIPSY Left  11/02/2016   Procedure: URETEROSCOPY WITH HOLMIUM LASER LITHOTRIPSY;  Surgeon: Vanna ScotlandBrandon, Ashley, MD;  Location: [facility] ORS;  Service: Urology;  Laterality: Left;    Prior to Admission medications   Medication Sig Start Date End Date Taking? Authorizing Provider  meloxicam (MOBIC) 15 MG tablet Take 1 tablet (15 mg total) by mouth daily for 7 days. 05/24/18 05/31/18  Orvil FeilWoods, Irianna Gilday M, PA-C  methocarbamol (ROBAXIN) 500 MG tablet Take 2 tablets (1,000 mg total) by mouth 4 (four) times daily. 02/18/17   Cuthriell, Delorise RoyalsJonathan D, PA-C  predniSONE (STERAPRED UNI-PAK 21 TAB) 10 MG (21) TBPK tablet Take 6 tablets on day 1. Take 5 tablets on day 2. Take 4 tablets on day 3. Take 3 tablets on day 4. Take 2 tablets on day 5. Take 1 tablets on day 6. 03/05/17   Little, Traci M, PA-C  traMADol (ULTRAM) 50 MG tablet Take 1 tablet (50 mg total) by mouth every 6 (six) hours as needed for up to 3 days. 05/24/18 05/27/18  Orvil FeilWoods, Alailah Safley M, PA-C    Allergies Patient has no known allergies.  Family History  Problem Relation Age of Onset  . Diabetes Father     Social History Social History   Tobacco Use  . Smoking status: Former Games developermoker  . Smokeless tobacco: Never Used  Substance Use Topics  . Alcohol use: Yes    Comment: occassional  . Drug use: No     Review of Systems  Constitutional: No fever/chills Eyes: No visual changes. No discharge ENT: No upper respiratory complaints. Cardiovascular: no chest pain. Respiratory: no cough. No  SOB. Gastrointestinal: No abdominal pain.  No nausea, no vomiting.  No diarrhea.  No constipation. Musculoskeletal: Patient has right knee pain.  Skin: Negative for rash, abrasions, lacerations, ecchymosis. Neurological: Negative for headaches, focal weakness or numbness.   ____________________________________________   PHYSICAL EXAM:  VITAL SIGNS: ED Triage Vitals  Enc Vitals Group     BP 05/24/18 1857 (!) 142/76     Pulse Rate 05/24/18 1857 81     Resp 05/24/18 1857  18     Temp 05/24/18 1857 98.2 F (36.8 C)     Temp Source 05/24/18 1857 Oral     SpO2 05/24/18 1857 98 %     Weight 05/24/18 1858 (!) 340 lb (154.2 kg)     Height 05/24/18 1858 6' (1.829 m)     Head Circumference --      Peak Flow --      Pain Score 05/24/18 1858 7     Pain Loc --      Pain Edu? --      Excl. in GC? --      Constitutional: Alert and oriented. Well appearing and in no acute distress. Eyes: Conjunctivae are normal. PERRL. EOMI. Head: Atraumatic. Cardiovascular: Normal rate, regular rhythm. Normal S1 and S2.  Good peripheral circulation. Respiratory: Normal respiratory effort without tachypnea or retractions. Lungs CTAB. Good air entry to the bases with no decreased or absent breath sounds. Musculoskeletal: Full range of motion to all extremities. No gross deformities appreciated. Neurologic:  Normal speech and language. No gross focal neurologic deficits are appreciated.  Skin:  Skin is warm, dry and intact. No rash noted. Psychiatric: Mood and affect are normal. Speech and behavior are normal. Patient exhibits appropriate insight and judgement.   ____________________________________________   LABS (all labs ordered are listed, but only abnormal results are displayed)  Labs Reviewed - No data to display ____________________________________________  EKG   ____________________________________________  RADIOLOGY I personally viewed and evaluated these images as part of my medical decision making, as well as reviewing the written report by the radiologist.  Dg Knee Complete 4 Views Right  Result Date: 05/24/2018 CLINICAL DATA:  Status post fall with acute on chronic right knee pain. EXAM: RIGHT KNEE - COMPLETE 4+ VIEW COMPARISON:  September 09, 2016 FINDINGS: No evidence of fracture, dislocation. There is a small suprapatellar effusion. Soft tissues are unremarkable. IMPRESSION: No acute abnormality identified. Electronically Signed   By: Sherian Rein M.D.   On:  05/24/2018 19:57    ____________________________________________    PROCEDURES  Procedure(s) performed:    Procedures    Medications - No data to display   ____________________________________________   INITIAL IMPRESSION / ASSESSMENT AND PLAN / ED COURSE  Pertinent labs & imaging results that were available during my care of the patient were reviewed by me and considered in my medical decision making (see chart for details).  Review of the [place] CSRS was performed in accordance of the [facility] prior to dispensing any controlled drugs.      Assessment and plan Right knee pain Patient presents to the emergency department with acute on chronic right knee pain for the past 5 days it seems to be progressively improving.  Patient reported that he experienced a new fall 5 days ago.  X-ray examination reveals no acute fractures or loose intra-articular bony bodies.  Patient's right knee was not grossly effused on physical exam.  Patient was treated with meloxicam and a short course of tramadol for pain.  Ice was recommended.  Patient was advised to follow-up with Dr. Devonne DoughtyNoris as needed.  All patient questions were answered.     ____________________________________________  FINAL CLINICAL IMPRESSION(S) / ED DIAGNOSES  Final diagnoses:  Acute pain of right knee      NEW MEDICATIONS STARTED DURING THIS VISIT:  ED Discharge Orders         Ordered    meloxicam (MOBIC) 15 MG tablet  Daily     05/24/18 2040    traMADol (ULTRAM) 50 MG tablet  Every 6 hours PRN     05/24/18 2040              This chart was dictated using voice recognition software/Dragon. Despite best efforts to proofread, errors can occur which can change the meaning. Any change was purely unintentional.    Orvil FeilWoods, Arali Somera M, PA-C 05/24/18 2050    Dionne BucySiadecki, Sebastian, MD 05/25/18 [phone]

## 2018-05-24 NOTE — ED Notes (Signed)

## 2018-09-02 IMAGING — CT CT RENAL STONE PROTOCOL
2 of 4 series · 14 of 46 positions shown, 16 images · non-contrast
Comparison: December 04, 2008

CLINICAL DATA: Left flank pain hematuria

EXAM:
CT ABDOMEN AND PELVIS WITHOUT CONTRAST
TECHNIQUE: Multidetector CT imaging of the abdomen and pelvis was performed
following the standard protocol without oral or intravenous contrast
material administration.

[Series 2: stone full standard · axial · 0.80mm/px · z∈[-386,+79]mm · 11 of 108 slices shown, 13 images]
[im 10/108  soft-tissue]
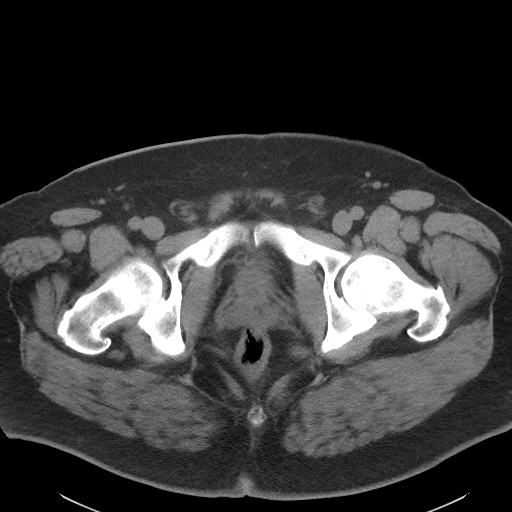
[im 10/108  bone]
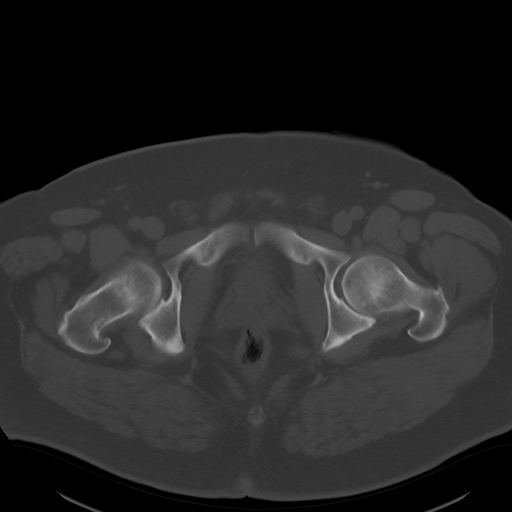
[im 19/108  soft-tissue]
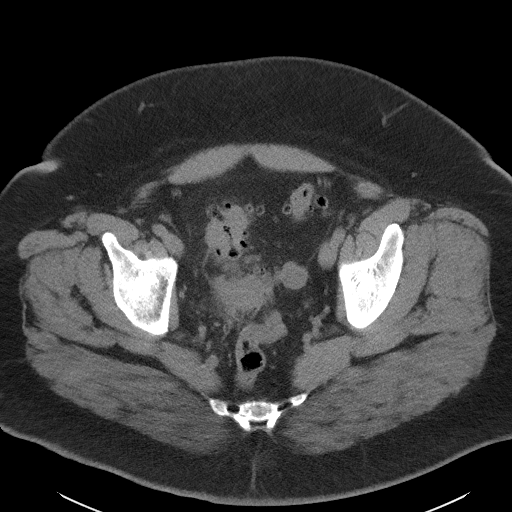
[im 28/108  soft-tissue]
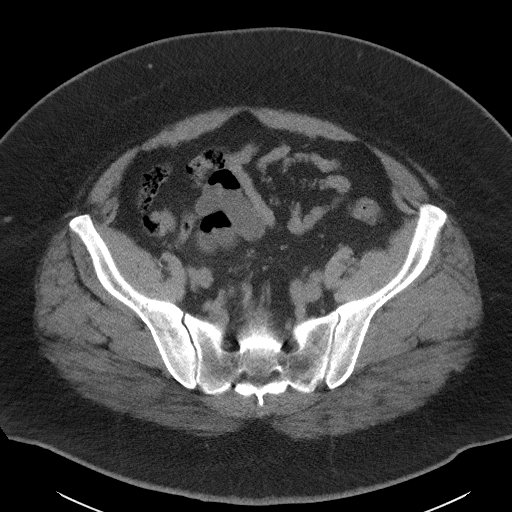
[im 38/108  soft-tissue]
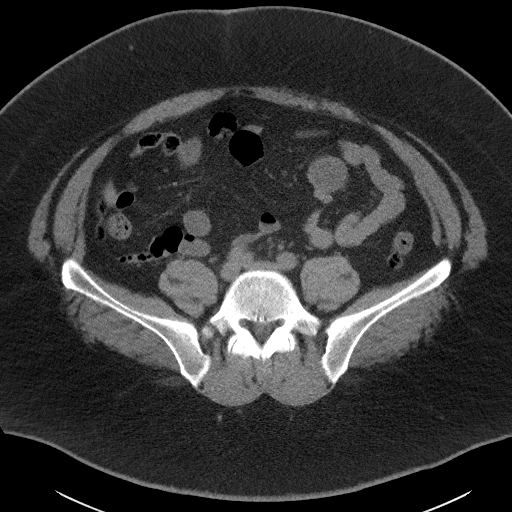
[im 47/108  soft-tissue]
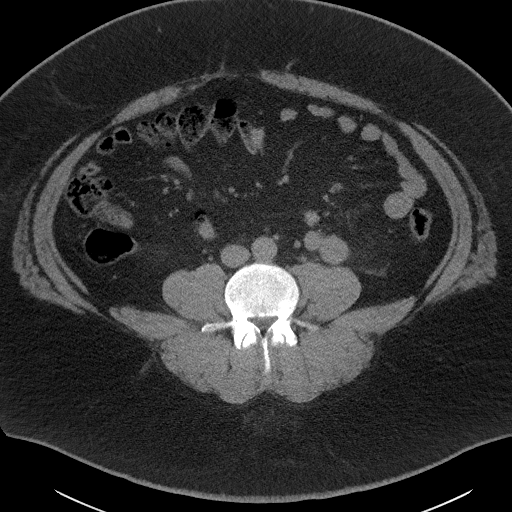
[im 56/108  soft-tissue]
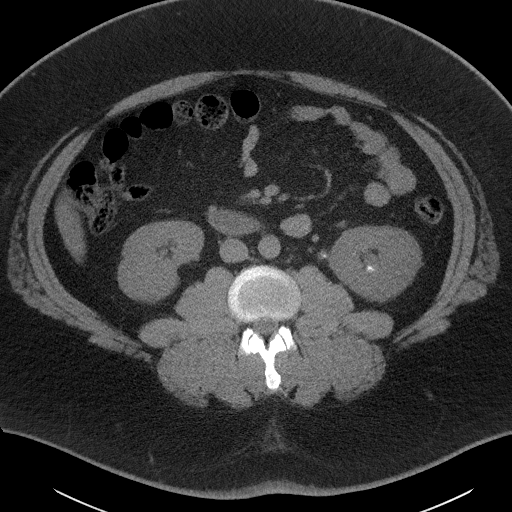
[im 66/108  soft-tissue]
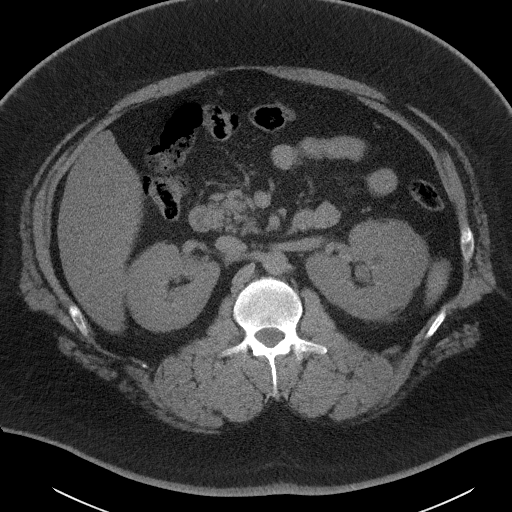
[im 75/108  soft-tissue]
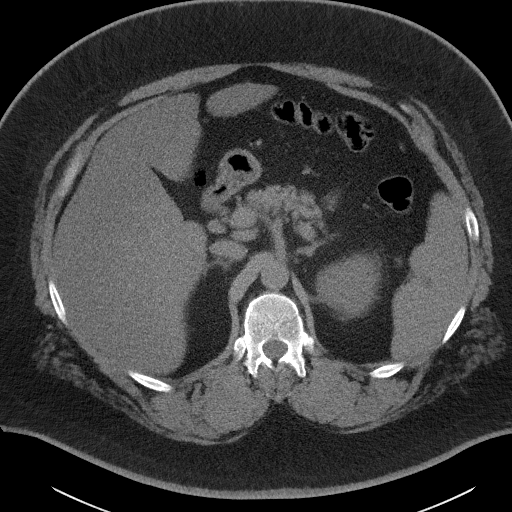
[im 84/108  soft-tissue]
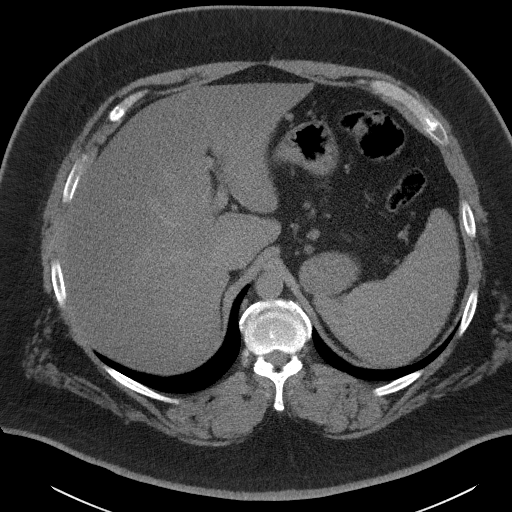
[im 84/108  bone]
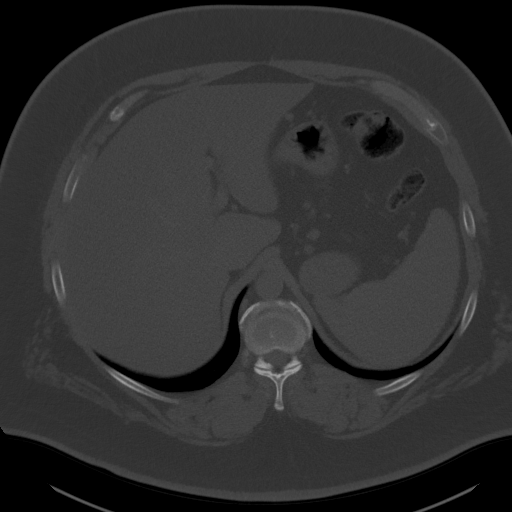
[im 94/108  soft-tissue]
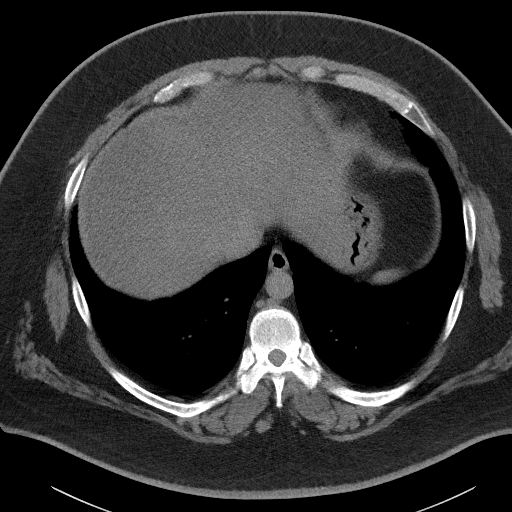
[im 103/108  soft-tissue]
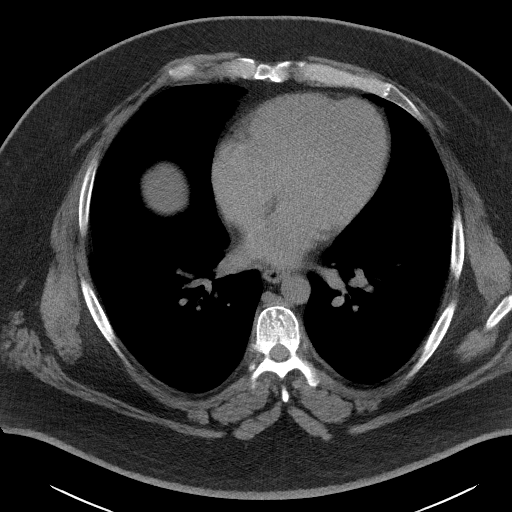

[Series 5: coronal · coronal · 0.92mm/px · 3 of 192 slices shown]
[im 64/192  soft-tissue]
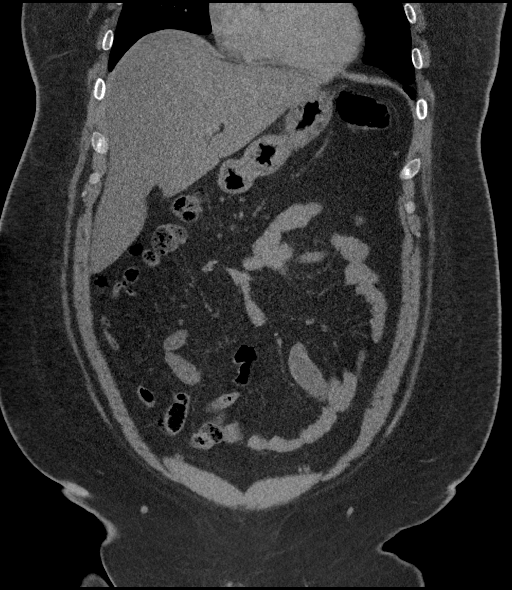
[im 85/192  soft-tissue]
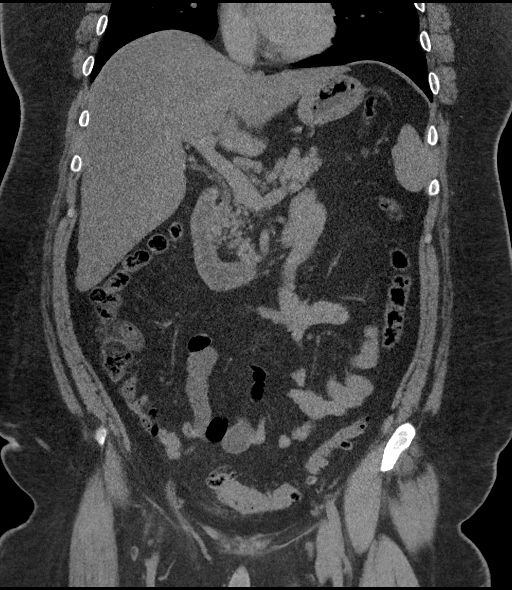
[im 107/192  soft-tissue]
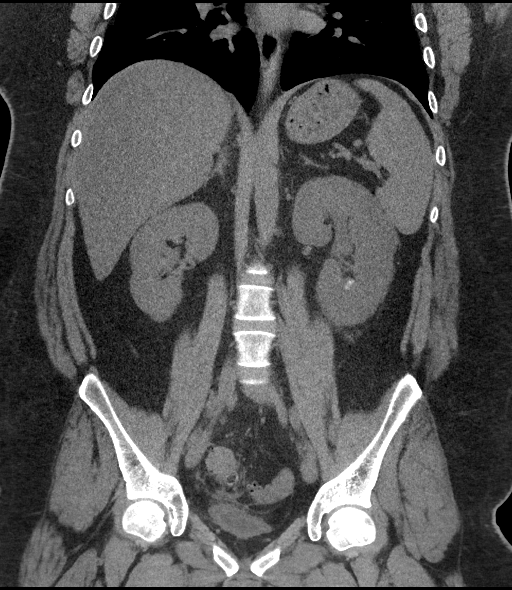

[14 of 46 positions shown; findings below may reference images not displayed]

FINDINGS: Lower chest: Lung bases are clear.

Hepatobiliary: Liver measures 25.8 cm in length. There is evidence
of a degree of hepatic steatosis. No focal liver lesions are evident
on this noncontrast enhanced study. Gallbladder is absent. There is
no biliary duct dilatation.

Pancreas: No pancreatic mass or inflammatory focus.

Spleen: No splenic lesions are evident. Spleen measures 15.0 x
x 8.6 cm with a measured splenic volume of 1,006 cubic cm.

Adrenals/Urinary Tract: Adrenals appear normal bilaterally. Right
kidney appears normal. Left kidney is edematous. There is no renal
mass on either side. There is no hydronephrosis on the right. There
is moderate hydronephrosis on the left. There is a 3 mm calculus in
the lower pole left kidney with a nearby 1 mm calculus. Slightly
more inferiorly on the left, there is a 3 x 3 mm calculus,
nonobstructing. There is also a 1 mm adjacent calculus in this area.
There is a calculus in the proximal left ureter just beyond the
ureteropelvic junction measuring 7 x 5 mm. There is a 2 x 1 mm
calculus immediately proximal to this larger calculus in the
proximal left ureter. No other ureteral calculi are identified on
either side. Urinary bladder is largely decompressed. Urinary
bladder wall thickness is upper normal given the degree of bladder
decompression.

Stomach/Bowel: There are multiple diverticula throughout the sigmoid
colon. There is localized wall thickening with surrounding edema in
the mid to distal sigmoid colon, inferior to the urinary bladder.
There is no frank abscess or perforation in this area. Several
prominent epiploic appendages are noted in this area. There is no
other evidence of bowel wall or mesenteric thickening. No bowel
obstruction. No free air or portal venous air.

Vascular/Lymphatic: There is no abdominal aortic aneurysm. No
vascular lesions are evident on this noncontrast study. There is no
adenopathy abdomen or pelvis.

Reproductive: Prostate and seminal vesicles appear normal in size
and contour. No pelvic mass evident.

Other: Appendix appears normal. No abscess or ascites evident in the
abdomen or pelvis. There is fat in the right inguinal ring. There is
a small ventral hernia containing only fat.

Musculoskeletal: There are foci of degenerative change in the lower
thoracic and lumbar spine regions. There are no blastic or lytic
bone lesions. There is no intramuscular or abdominal wall lesion.
IMPRESSION: Moderate hydronephrosis on the left with left renal edema. There is
a 7 x 5 mm calculus in the proximal left ureter slightly beyond the
ureteropelvic junction. Just proximal to this calculus, there is a 2
x 1 mm calculus in the proximal left ureter as well. There are
nonobstructing calculi in the lower pole left kidney.

Probable epiploic appendagitis in the sigmoid colon region with
localized wall thickening and mild mesenteric fluid/inflammation.
There may be a degree of superimposed diverticulitis, although there
is no focal irregular diverticulum apparent by CT. There are
multiple sigmoid diverticulum. There is urinary bladder wall
thickening just superior to this inflammation involving the mid to
distal sigmoid colon. This wall thickening may be secondary to the
nearby inflammation as opposed to cystitis arising from the bladder.

There is hepatomegaly and splenomegaly of uncertain etiology. There
is hepatic steatosis.

Appendix appears normal.  No abscess.  Gallbladder absent.

Small ventral hernia containing only fat.

## 2019-05-26 ENCOUNTER — Other Ambulatory Visit: Payer: Self-pay

## 2019-05-26 ENCOUNTER — Emergency Department: Payer: BC Managed Care – PPO

## 2019-05-26 ENCOUNTER — Inpatient Hospital Stay
Admission: EM | Admit: 2019-05-26 | Discharge: 2019-05-31 | DRG: 177 | Disposition: A | Discharge status: Home Health | Payer: BC Managed Care – PPO | Attending: Internal Medicine | Admitting: Internal Medicine

## 2019-05-26 DIAGNOSIS — J1282 Pneumonia due to coronavirus disease 2019: Secondary | ICD-10-CM | POA: Diagnosis present

## 2019-05-26 DIAGNOSIS — Z833 Family history of diabetes mellitus: Secondary | ICD-10-CM | POA: Diagnosis not present

## 2019-05-26 DIAGNOSIS — Z6841 Body Mass Index (BMI) 40.0 and over, adult: Secondary | ICD-10-CM | POA: Diagnosis not present

## 2019-05-26 DIAGNOSIS — J1289 Other viral pneumonia: Secondary | ICD-10-CM | POA: Diagnosis not present

## 2019-05-26 DIAGNOSIS — E1165 Type 2 diabetes mellitus with hyperglycemia: Secondary | ICD-10-CM | POA: Diagnosis present

## 2019-05-26 DIAGNOSIS — U071 COVID-19: Secondary | ICD-10-CM | POA: Diagnosis present

## 2019-05-26 DIAGNOSIS — J9601 Acute respiratory failure with hypoxia: Secondary | ICD-10-CM | POA: Diagnosis present

## 2019-05-26 DIAGNOSIS — Z87891 Personal history of nicotine dependence: Secondary | ICD-10-CM | POA: Diagnosis not present

## 2019-05-26 DIAGNOSIS — R0902 Hypoxemia: Secondary | ICD-10-CM | POA: Diagnosis not present

## 2019-05-26 DIAGNOSIS — R739 Hyperglycemia, unspecified: Secondary | ICD-10-CM | POA: Diagnosis not present

## 2019-05-26 LAB — BASIC METABOLIC PANEL
Anion gap: 11 (ref 5–15)
BUN: 9 mg/dL (ref 6–20)
CO2: 22 mmol/L (ref 22–32)
Calcium: 8.6 mg/dL — ABNORMAL LOW (ref 8.9–10.3)
Chloride: 98 mmol/L (ref 98–111)
Creatinine, Ser: 0.77 mg/dL (ref 0.61–1.24)
GFR calc Af Amer: >60 mL/min (ref >60)
GFR calc non Af Amer: >60 mL/min (ref >60)
Glucose, Bld: 331 mg/dL — ABNORMAL HIGH (ref 70–99)
Potassium: 4.5 mmol/L (ref 3.5–5.1)
Sodium: 131 mmol/L — ABNORMAL LOW (ref 135–145)

## 2019-05-26 LAB — CBC
HCT: 48 % (ref 39.0–52.0)
HCT: 45 % (ref 39.0–52.0)
Hemoglobin: 16.4 g/dL (ref 13.0–17.0)
Hemoglobin: 15.5 g/dL (ref 13.0–17.0)
MCH: 28.9 pg (ref 26.0–34.0)
MCH: 28.9 pg (ref 26.0–34.0)
MCHC: 34.2 g/dL (ref 30.0–36.0)
MCHC: 34.4 g/dL (ref 30.0–36.0)
MCV: 84.5 fL (ref 80.0–100.0)
MCV: 84 fL (ref 80.0–100.0)
Platelets: 162 10*3/uL (ref 150–400)
Platelets: 155 10*3/uL (ref 150–400)
RBC: 5.68 MIL/uL (ref 4.22–5.81)
RBC: 5.36 MIL/uL (ref 4.22–5.81)
RDW: 12.5 % (ref 11.5–15.5)
RDW: 12.5 % (ref 11.5–15.5)
WBC: 4.8 10*3/uL (ref 4.0–10.5)
WBC: 3.9 10*3/uL — ABNORMAL LOW (ref 4.0–10.5)
nRBC: 0 % (ref 0.0–0.2)
nRBC: 0 % (ref 0.0–0.2)

## 2019-05-26 LAB — COMPREHENSIVE METABOLIC PANEL
ALT: 51 U/L — ABNORMAL HIGH (ref 0–44)
AST: 44 U/L — ABNORMAL HIGH (ref 15–41)
Albumin: 3.2 g/dL — ABNORMAL LOW (ref 3.5–5.0)
Alkaline Phosphatase: 78 U/L (ref 38–126)
Anion gap: 12 (ref 5–15)
BUN: 8 mg/dL (ref 6–20)
CO2: 20 mmol/L — ABNORMAL LOW (ref 22–32)
Calcium: 8.3 mg/dL — ABNORMAL LOW (ref 8.9–10.3)
Chloride: 99 mmol/L (ref 98–111)
Creatinine, Ser: 0.64 mg/dL (ref 0.61–1.24)
GFR calc Af Amer: >60 mL/min (ref >60)
GFR calc non Af Amer: >60 mL/min (ref >60)
Glucose, Bld: 338 mg/dL — ABNORMAL HIGH (ref 70–99)
Potassium: 4 mmol/L (ref 3.5–5.1)
Sodium: 131 mmol/L — ABNORMAL LOW (ref 135–145)
Total Bilirubin: 1 mg/dL (ref 0.3–1.2)
Total Protein: 7.2 g/dL (ref 6.5–8.1)

## 2019-05-26 LAB — FIBRINOGEN: Fibrinogen: 717 mg/dL — ABNORMAL HIGH (ref 210–475)

## 2019-05-26 LAB — TRIGLYCERIDES: Triglycerides: 84 mg/dL (ref <150)

## 2019-05-26 LAB — FERRITIN: Ferritin: 928 ng/mL — ABNORMAL HIGH (ref 24–336)

## 2019-05-26 LAB — TROPONIN I (HIGH SENSITIVITY)
Troponin I (High Sensitivity): <2 ng/L (ref <18)
Troponin I (High Sensitivity): 4 ng/L (ref <18)

## 2019-05-26 LAB — PROCALCITONIN: Procalcitonin: <0.1 ng/mL

## 2019-05-26 LAB — FIBRIN DERIVATIVES D-DIMER (ARMC ONLY): Fibrin derivatives D-dimer (ARMC): 1486.45 ng/mL (FEU) — ABNORMAL HIGH (ref 0.00–499.00)

## 2019-05-26 LAB — C-REACTIVE PROTEIN: CRP: 18.4 mg/dL — ABNORMAL HIGH (ref <1.0)

## 2019-05-26 LAB — LACTATE DEHYDROGENASE: LDH: 290 U/L — ABNORMAL HIGH (ref 98–192)

## 2019-05-26 LAB — LACTIC ACID, PLASMA: Lactic Acid, Venous: 1.6 mmol/L (ref 0.5–1.9)

## 2019-05-26 MED ORDER — ONDANSETRON HCL 4 MG PO TABS: 4.0000 mg | ORAL_TABLET | Freq: Four times a day (QID) | ORAL | Status: DC | PRN

## 2019-05-26 MED ORDER — SODIUM CHLORIDE 0.9 % IV SOLN
2.0000 g | INTRAVENOUS | Status: DC
  Administered 2019-05-26 – 2019-05-30 (×5): 2 g via INTRAVENOUS
  Filled 2019-05-26 (×2): qty 2
  Filled 2019-05-26: qty 20
  Filled 2019-05-26: qty 2
  Filled 2019-05-26: qty 20
  Filled 2019-05-26: qty 2

## 2019-05-26 MED ORDER — ALBUTEROL SULFATE HFA 108 (90 BASE) MCG/ACT IN AERS: 2.0000 | INHALATION_SPRAY | Freq: Once | RESPIRATORY_TRACT | Status: DC

## 2019-05-26 MED ORDER — INSULIN ASPART 100 UNIT/ML ~~LOC~~ SOLN
0.0000 [IU] | Freq: Three times a day (TID) | SUBCUTANEOUS | Status: DC
  Administered 2019-05-27 – 2019-05-28 (×4): 11 [IU] via SUBCUTANEOUS
  Administered 2019-05-28: 15 [IU] via SUBCUTANEOUS
  Administered 2019-05-28: 12:00:00 11 [IU] via SUBCUTANEOUS
  Filled 2019-05-26 (×6): qty 1

## 2019-05-26 MED ORDER — ONDANSETRON HCL 4 MG/2ML IJ SOLN: 4.0000 mg | Freq: Four times a day (QID) | INTRAMUSCULAR | Status: DC | PRN

## 2019-05-26 MED ORDER — ENOXAPARIN SODIUM 40 MG/0.4ML ~~LOC~~ SOLN
40.0000 mg | Freq: Two times a day (BID) | SUBCUTANEOUS | Status: DC
  Administered 2019-05-27 – 2019-05-31 (×10): 40 mg via SUBCUTANEOUS
  Filled 2019-05-26 (×10): qty 0.4

## 2019-05-26 MED ORDER — GUAIFENESIN-DM 100-10 MG/5ML PO SYRP
10.0000 mL | ORAL_SOLUTION | ORAL | Status: DC | PRN
  Administered 2019-05-27 – 2019-05-31 (×7): 10 mL via ORAL
  Filled 2019-05-26 (×8): qty 10

## 2019-05-26 MED ORDER — ALBUTEROL SULFATE HFA 108 (90 BASE) MCG/ACT IN AERS
2.0000 | INHALATION_SPRAY | Freq: Four times a day (QID) | RESPIRATORY_TRACT | Status: DC
  Administered 2019-05-27 – 2019-05-31 (×18): 2 via RESPIRATORY_TRACT
  Filled 2019-05-26: qty 6.7

## 2019-05-26 MED ORDER — SODIUM CHLORIDE 0.9 % IV SOLN
100.0000 mg | Freq: Every day | INTRAVENOUS | Status: AC
  Administered 2019-05-27 – 2019-05-30 (×4): 100 mg via INTRAVENOUS
  Filled 2019-05-26 (×4): qty 100

## 2019-05-26 MED ORDER — ALBUTEROL SULFATE HFA 108 (90 BASE) MCG/ACT IN AERS
2.0000 | INHALATION_SPRAY | Freq: Once | RESPIRATORY_TRACT | Status: AC
  Administered 2019-05-26: 2 via RESPIRATORY_TRACT
  Filled 2019-05-26: qty 6.7

## 2019-05-26 MED ORDER — ZINC SULFATE 220 (50 ZN) MG PO CAPS
220.0000 mg | ORAL_CAPSULE | Freq: Every day | ORAL | Status: DC
  Administered 2019-05-27 – 2019-05-31 (×6): 220 mg via ORAL
  Filled 2019-05-26 (×6): qty 1

## 2019-05-26 MED ORDER — ASCORBIC ACID 500 MG PO TABS
500.0000 mg | ORAL_TABLET | Freq: Every day | ORAL | Status: DC
  Administered 2019-05-27 – 2019-05-31 (×6): 500 mg via ORAL
  Filled 2019-05-26 (×6): qty 1

## 2019-05-26 MED ORDER — SODIUM CHLORIDE 0.9 % IV SOLN
200.0000 mg | Freq: Once | INTRAVENOUS | Status: AC
  Administered 2019-05-26: 200 mg via INTRAVENOUS
  Filled 2019-05-26: qty 200

## 2019-05-26 MED ORDER — ACETAMINOPHEN 325 MG PO TABS: 650.0000 mg | ORAL_TABLET | Freq: Four times a day (QID) | ORAL | Status: DC | PRN

## 2019-05-26 MED ORDER — ADULT MULTIVITAMIN W/MINERALS CH
1.0000 | ORAL_TABLET | Freq: Every day | ORAL | Status: DC
  Administered 2019-05-27 – 2019-05-31 (×6): 1 via ORAL
  Filled 2019-05-26 (×6): qty 1

## 2019-05-26 MED ORDER — DEXAMETHASONE SODIUM PHOSPHATE 10 MG/ML IJ SOLN
10.0000 mg | Freq: Once | INTRAMUSCULAR | Status: AC
  Administered 2019-05-26: 10 mg via INTRAVENOUS
  Filled 2019-05-26: qty 1

## 2019-05-26 MED ORDER — SODIUM CHLORIDE 0.9 % IV SOLN
500.0000 mg | INTRAVENOUS | Status: DC
  Administered 2019-05-26 – 2019-05-30 (×5): 500 mg via INTRAVENOUS
  Filled 2019-05-26 (×6): qty 500

## 2019-05-26 MED ORDER — DEXAMETHASONE 4 MG PO TABS
6.0000 mg | ORAL_TABLET | ORAL | Status: DC
  Administered 2019-05-27 – 2019-05-30 (×4): 6 mg via ORAL
  Filled 2019-05-26 (×6): qty 1.5

## 2019-05-26 MED ORDER — SODIUM CHLORIDE 0.9 % IV SOLN
INTRAVENOUS | Status: DC | PRN
  Administered 2019-05-26 – 2019-05-28 (×3): 500 mL via INTRAVENOUS

## 2019-05-26 MED ORDER — SENNOSIDES-DOCUSATE SODIUM 8.6-50 MG PO TABS: 1.0000 | ORAL_TABLET | Freq: Every evening | ORAL | Status: DC | PRN

## 2019-05-26 NOTE — ED Notes (Signed)
Pt currently sitting on edge of bed dry heaving. Has emesis bag. Is also coughing.

## 2019-05-26 NOTE — ED Notes (Signed)
Brittney NT and this RN attempting for cultures now.

## 2019-05-26 NOTE — ED Notes (Signed)
Pt showed this RN email with recent covid positive result.

## 2019-05-26 NOTE — ED Notes (Signed)
Brittney NT collected 1st set of cultures at L hand. This RN attempted at R hand without success.

## 2019-05-26 NOTE — ED Notes (Signed)
Pt walked short distance with this RN. Pt begins coughing immediately once sitting on edge of bed. Continues to cough frequently while walking. Immediately drops to 88%. Once pt stops coughing inc to 90% RA. After laying in bed for 74mins pt 91-92% RA. Tachypnic. Placed on 2L via [place].

## 2019-05-26 NOTE — H&P (Signed)
History and Physical    Wesley Meyer [id number] DOB: 02/26/1986 DOA: 05/26/2019  PCP: Patient, No Pcp Per  Patient coming from: home  I have personally briefly reviewed [facility]  Chief Complaint: shortness of breath  HPI: Wesley Meyer is a 33 y.o. male who is morbidly obese and with no significant past medical history who presents to the emergency room with a 1 week history of shortness of breath.  Developed a fever on 05/18/19 and tested positive for Covid on 05/20/2019.  He denies chest pain.  He does have some nausea and has been having vomiting and diarrhea. ED Course: On arrival in the emergency room he was afebrile with a temperature 98.1, blood pressure 127/78 he was tachycardic at 104 and tachypneic at 24 with oxygen saturation 88 on room air improving to 95% on O2 at 2 L.  His blood work was significant for a blood sugar of 331 prior history of diabetes.  X-ray showed airspace opacities consistent with pneumonia.  She was started on dexamethasone and remdesivir and hospitalist consulted for admission.  Review of Systems: As per HPI otherwise 10 point review of systems negative.    Past Medical History:  Diagnosis Date  . Pre-diabetes     Past Surgical History:  Procedure Laterality Date  . CHOLECYSTECTOMY    . COLONOSCOPY    . CYSTOSCOPY WITH STENT PLACEMENT Left 11/02/2016   Procedure: CYSTOSCOPY WITH STENT PLACEMENT;  Surgeon: Vanna ScotlandBrandon, Ashley, MD;  Location: [facility] ORS;  Service: Urology;  Laterality: Left;  . EXTRACORPOREAL SHOCK WAVE LITHOTRIPSY Left 11/02/2016   Procedure: EXTRACORPOREAL SHOCK WAVE LITHOTRIPSY (ESWL);  Surgeon: Vanna ScotlandBrandon, Ashley, MD;  Location: [facility] ORS;  Service: Urology;  Laterality: Left;  Marland Kitchen. QUADRICEPS TENDON REPAIR Right 09/18/2016   Procedure: REPAIR QUADRICEP TENDON;  Surgeon: Deeann SaintHoward Miller, MD;  Location: [facility] ORS;  Service: Orthopedics;  Laterality: Right;  . URETEROSCOPY WITH HOLMIUM  LASER LITHOTRIPSY Left 11/02/2016   Procedure: URETEROSCOPY WITH HOLMIUM LASER LITHOTRIPSY;  Surgeon: Vanna ScotlandBrandon, Ashley, MD;  Location: [facility] ORS;  Service: Urology;  Laterality: Left;     reports that he has quit smoking. He has never used smokeless tobacco. He reports current alcohol use. He reports that he does not use drugs.  No Known Allergies  Family History  Problem Relation Age of Onset  . Diabetes Father      Prior to Admission medications   Medication Sig Start Date End Date Taking? Authorizing Provider  acetaminophen (TYLENOL) 325 MG tablet Take 650 mg by mouth every 6 (six) hours as needed.   Yes [provider]  ibuprofen (ADVIL) 200 MG tablet Take 200 mg by mouth every 6 (six) hours as needed.   Yes [provider]    Physical Exam: Vitals:   05/26/19 1945 05/26/19 2000 05/26/19 2015 05/26/19 2030  BP:      Pulse: 97 94 93 92  Resp:  (!) 28 (!) 24 (!) 26  Temp:      TempSrc:      SpO2: 98% 97% 98% 97%  Weight:      Height:         Vitals:   05/26/19 1945 05/26/19 2000 05/26/19 2015 05/26/19 2030  BP:      Pulse: 97 94 93 92  Resp:  (!) 28 (!) 24 (!) 26  Temp:      TempSrc:      SpO2: 98% 97% 98% 97%  Weight:  Height:        Constitutional: NAD, alert and oriented x 3 Eyes: PERRL, lids and conjunctivae normal ENMT: Mucous membranes are moist.  Neck: normal, supple, no masses, no thyromegaly Respiratory: tachypnea, with increased work of breathing, few wheezes and ronchi.  Cardiovascular: Regular rate and rhythm, no murmurs / rubs / gallops. No extremity edema. 2+ pedal pulses. No carotid bruits.  Abdomen: no tenderness, no masses palpated. No hepatosplenomegaly. Bowel sounds positive.  Musculoskeletal: no clubbing / cyanosis. No joint deformity upper and lower extremities.  Skin: no rashes, lesions, ulcers.  Neurologic: No gross focal neurologic deficit. Psychiatric: Normal mood and affect.   Labs on Admission: I have personally  reviewed following labs and imaging studies  CBC: Recent Labs  Lab 05/26/19 1334  WBC 4.8  HGB 16.4  HCT 48.0  MCV 84.5  PLT 235   Basic Metabolic Panel: Recent Labs  Lab 05/26/19 1334 05/26/19 1922  NA 131* 131*  K 4.5 4.0  CL 98 99  CO2 22 20*  GLUCOSE 331* 338*  BUN 9 8  CREATININE 0.77 0.64  CALCIUM 8.6* 8.3*   GFR: Estimated Creatinine Clearance: 187.2 mL/min (by C-G formula based on SCr of 0.64 mg/dL). Liver Function Tests: Recent Labs  Lab 05/26/19 1922  AST 44*  ALT 51*  ALKPHOS 78  BILITOT 1.0  PROT 7.2  ALBUMIN 3.2*   No results for input(s): LIPASE, AMYLASE in the last 168 hours. No results for input(s): AMMONIA in the last 168 hours. Coagulation Profile: No results for input(s): INR, PROTIME in the last 168 hours. Cardiac Enzymes: No results for input(s): CKTOTAL, CKMB, CKMBINDEX, TROPONINI in the last 168 hours. BNP (last 3 results) No results for input(s): PROBNP in the last 8760 hours. HbA1C: No results for input(s): HGBA1C in the last 72 hours. CBG: No results for input(s): GLUCAP in the last 168 hours. Lipid Profile: No results for input(s): CHOL, HDL, LDLCALC, TRIG, CHOLHDL, LDLDIRECT in the last 72 hours. Thyroid Function Tests: No results for input(s): TSH, T4TOTAL, FREET4, T3FREE, THYROIDAB in the last 72 hours. Anemia Panel: Recent Labs    05/26/19 1922  FERRITIN 928*   Urine analysis:    Component Value Date/Time   COLORURINE YELLOW (A) 10/26/2016 0956   APPEARANCEUR Clear 10/30/2016 1608   LABSPEC 1.020 10/26/2016 0956   PHURINE 6.0 10/26/2016 0956   GLUCOSEU Negative 10/30/2016 1608   HGBUR LARGE (A) 10/26/2016 0956   BILIRUBINUR Negative 10/30/2016 [place] 10/26/2016 0956   PROTEINUR Negative 10/30/2016 1608   PROTEINUR 30 (A) 10/26/2016 0956   NITRITE Negative 10/30/2016 1608   NITRITE NEGATIVE 10/26/2016 0956   LEUKOCYTESUR Negative 10/30/2016 1608    Radiological Exams on Admission: DG Chest  2 View  Result Date: 05/26/2019 CLINICAL DATA:  Shortness of breath. Additional provided: Shortness of breath and cough for 2 days. EXAM: CHEST - 2 VIEW COMPARISON:  No pertinent prior studies available for comparison. FINDINGS: Shallow inspiration radiograph with accentuation of the cardiac silhouette and crowding of the central bronchovascular markings. The heart does not appear enlarged. Extensive ill-defined opacities throughout both lungs. No evidence of pleural effusion or pneumothorax. No acute bony abnormality. IMPRESSION: Shallow inspiration radiograph. Extensive ill-defined airspace opacities throughout both lungs are nonspecific, but consistent with pneumonia in the appropriate clinical setting. Clinical correlation and radiographic follow-up to resolution recommended. Electronically Signed   By: Kellie Simmering DO   On: 05/26/2019 14:04    EKG: Independently reviewed.   Assessment/Plan Active  Problems:   Pneumonia due to COVID-19 virus --IV dexamethasone and remdesivir, vitamins --Supplemental oxygen to keep sats over 90% --Follow inflammatory markers    Hyperglycemia, without history of diabetes, suspect new onset type II diabetes --Sugar was 331 --Follow A1c --Supplemental insulin coverage --Will need diabetic educator consult      DVT prophylaxis: lovenox  Code Status: full   Disposition Plan: Back to previous home environment Consults called: none     Andris Baumann MD [facility]     05/26/2019, 8:41 PM

## 2019-05-26 NOTE — ED Provider Notes (Signed)
[facility] Emergency Department Provider Note    First MD Initiated Contact with Patient 05/26/19 1805     (approximate)  I have reviewed the triage vital signs and the nursing notes.   HISTORY  Chief Complaint Shortness of Breath (covid positive)    HPI Wesley Meyer is a 33 y.o. male the below listed past medical history of recent diagnosis of COVID-19 on the 22nd of this month presents to the ER for worsening shortness of breath exertional dyspnea fatigue fevers and chills.  Is having nonproductive dry hacking cough feels like he is about to pass out anytime he ambulates.  Denies any history of lung issues no history of asthma or COPD.  Does have a remote smoking history.    Past Medical History:  Diagnosis Date  . Pre-diabetes    Family History  Problem Relation Age of Onset  . Diabetes Father    Past Surgical History:  Procedure Laterality Date  . CHOLECYSTECTOMY    . COLONOSCOPY    . CYSTOSCOPY WITH STENT PLACEMENT Left 11/02/2016   Procedure: CYSTOSCOPY WITH STENT PLACEMENT;  Surgeon: Vanna Scotland, MD;  Location: [facility] ORS;  Service: Urology;  Laterality: Left;  . EXTRACORPOREAL SHOCK WAVE LITHOTRIPSY Left 11/02/2016   Procedure: EXTRACORPOREAL SHOCK WAVE LITHOTRIPSY (ESWL);  Surgeon: Vanna Scotland, MD;  Location: [facility] ORS;  Service: Urology;  Laterality: Left;  Marland Kitchen QUADRICEPS TENDON REPAIR Right 09/18/2016   Procedure: REPAIR QUADRICEP TENDON;  Surgeon: Deeann Saint, MD;  Location: [facility] ORS;  Service: Orthopedics;  Laterality: Right;  . URETEROSCOPY WITH HOLMIUM LASER LITHOTRIPSY Left 11/02/2016   Procedure: URETEROSCOPY WITH HOLMIUM LASER LITHOTRIPSY;  Surgeon: Vanna Scotland, MD;  Location: [facility] ORS;  Service: Urology;  Laterality: Left;   Patient Active Problem List   Diagnosis Date Noted  . Pneumonia due to COVID-19 virus 05/26/2019  . Hypoxia 05/26/2019  . Hyperglycemia 05/26/2019  . Obesity 12/26/2016  . Rupture of  quadriceps tendon 09/29/2016  . Strain of right quadriceps muscle 09/13/2016      Prior to Admission medications   Medication Sig Start Date End Date Taking? Authorizing Provider  acetaminophen (TYLENOL) 325 MG tablet Take 650 mg by mouth every 6 (six) hours as needed.   Yes [provider]  ibuprofen (ADVIL) 200 MG tablet Take 200 mg by mouth every 6 (six) hours as needed.   Yes [provider]    Allergies Patient has no known allergies.    Social History Social History   Tobacco Use  . Smoking status: Former Games developer  . Smokeless tobacco: Never Used  Substance Use Topics  . Alcohol use: Yes    Comment: occassional  . Drug use: No    Review of Systems Patient denies headaches, rhinorrhea, blurry vision, numbness, shortness of breath, chest pain, edema, cough, abdominal pain, nausea, vomiting, diarrhea, dysuria, fevers, rashes or hallucinations unless otherwise stated above in HPI. ____________________________________________   PHYSICAL EXAM:  VITAL SIGNS: Vitals:   05/26/19 2245 05/26/19 2246  BP:    Pulse: 93   Resp:  (!) 30  Temp:    SpO2: 95%     Constitutional: Alert and oriented. Ill appearing  Eyes: Conjunctivae are normal.  Head: Atraumatic. Nose: No congestion/rhinnorhea. Mouth/Throat: Mucous membranes are moist.   Neck: No stridor. Painless ROM.  Cardiovascular: Normal rate, regular rhythm. Grossly normal heart sounds.  Good peripheral circulation. Respiratory: tachypnea with shallow respirations,  Coarse bilateral breathsounds Gastrointestinal: Soft and nontender. No distention. No abdominal bruits. No CVA  tenderness. Genitourinary:  Musculoskeletal: No lower extremity tenderness nor edema.  No joint effusions. Neurologic:  Normal speech and language. No gross focal neurologic deficits are appreciated. No facial droop Skin:  Skin is warm, dry and intact. No rash noted. Psychiatric: Mood and affect are normal. Speech and behavior  are normal.  ____________________________________________   LABS (all labs ordered are listed, but only abnormal results are displayed)  Results for orders placed or performed during the hospital encounter of 05/26/19 (from the past 24 hour(s))  CBC     Status: None   Collection Time: 05/26/19  1:34 PM  Result Value Ref Range   WBC 4.8 4.0 - 10.5 K/uL   RBC 5.68 4.22 - 5.81 MIL/uL   Hemoglobin 16.4 13.0 - 17.0 g/dL   HCT 48.0 39.0 - 52.0 %   MCV 84.5 80.0 - 100.0 fL   MCH 28.9 26.0 - 34.0 pg   MCHC 34.2 30.0 - 36.0 g/dL   RDW 12.5 11.5 - 15.5 %   Platelets 162 150 - 400 K/uL   nRBC 0.0 0.0 - 0.2 %  Basic metabolic panel     Status: Abnormal   Collection Time: 05/26/19  1:34 PM  Result Value Ref Range   Sodium 131 (L) 135 - 145 mmol/L   Potassium 4.5 3.5 - 5.1 mmol/L   Chloride 98 98 - 111 mmol/L   CO2 22 22 - 32 mmol/L   Glucose, Bld 331 (H) 70 - 99 mg/dL   BUN 9 6 - 20 mg/dL   Creatinine, Ser 0.77 0.61 - 1.24 mg/dL   Calcium 8.6 (L) 8.9 - 10.3 mg/dL   GFR calc non Af Amer >60 >60 mL/min   GFR calc Af Amer >60 >60 mL/min   Anion gap 11 5 - 15  Troponin I (High Sensitivity)     Status: None   Collection Time: 05/26/19  1:34 PM  Result Value Ref Range   Troponin I (High Sensitivity) <2 <18 ng/L  Troponin I (High Sensitivity)     Status: None   Collection Time: 05/26/19  4:03 PM  Result Value Ref Range   Troponin I (High Sensitivity) 4 <18 ng/L  Lactic acid, plasma     Status: None   Collection Time: 05/26/19  7:22 PM  Result Value Ref Range   Lactic Acid, Venous 1.6 0.5 - 1.9 mmol/L  Comprehensive metabolic panel     Status: Abnormal   Collection Time: 05/26/19  7:22 PM  Result Value Ref Range   Sodium 131 (L) 135 - 145 mmol/L   Potassium 4.0 3.5 - 5.1 mmol/L   Chloride 99 98 - 111 mmol/L   CO2 20 (L) 22 - 32 mmol/L   Glucose, Bld 338 (H) 70 - 99 mg/dL   BUN 8 6 - 20 mg/dL   Creatinine, Ser 0.64 0.61 - 1.24 mg/dL   Calcium 8.3 (L) 8.9 - 10.3 mg/dL   Total  Protein 7.2 6.5 - 8.1 g/dL   Albumin 3.2 (L) 3.5 - 5.0 g/dL   AST 44 (H) 15 - 41 U/L   ALT 51 (H) 0 - 44 U/L   Alkaline Phosphatase 78 38 - 126 U/L   Total Bilirubin 1.0 0.3 - 1.2 mg/dL   GFR calc non Af Amer >60 >60 mL/min   GFR calc Af Amer >60 >60 mL/min   Anion gap 12 5 - 15  Fibrin derivatives D-Dimer     Status: Abnormal   Collection Time: 05/26/19  7:22 PM  Result Value Ref Range   Fibrin derivatives D-dimer ([facility]) 1,486.45 (H) 0.00 - 499.00 ng/mL (FEU)  Procalcitonin     Status: None   Collection Time: 05/26/19  7:22 PM  Result Value Ref Range   Procalcitonin <0.10 ng/mL  Lactate dehydrogenase     Status: Abnormal   Collection Time: 05/26/19  7:22 PM  Result Value Ref Range   LDH 290 (H) 98 - 192 U/L  Ferritin     Status: Abnormal   Collection Time: 05/26/19  7:22 PM  Result Value Ref Range   Ferritin 928 (H) 24 - 336 ng/mL  Triglycerides     Status: None   Collection Time: 05/26/19  7:22 PM  Result Value Ref Range   Triglycerides 84 <150 mg/dL  Fibrinogen     Status: Abnormal   Collection Time: 05/26/19  7:22 PM  Result Value Ref Range   Fibrinogen 717 (H) 210 - 475 mg/dL  CBC     Status: Abnormal   Collection Time: 05/26/19 10:40 PM  Result Value Ref Range   WBC 3.9 (L) 4.0 - 10.5 K/uL   RBC 5.36 4.22 - 5.81 MIL/uL   Hemoglobin 15.5 13.0 - 17.0 g/dL   HCT 16.145.0 09.639.0 - 04.552.0 %   MCV 84.0 80.0 - 100.0 fL   MCH 28.9 26.0 - 34.0 pg   MCHC 34.4 30.0 - 36.0 g/dL   RDW 40.912.5 81.111.5 - 91.415.5 %   Platelets 155 150 - 400 K/uL   nRBC 0.0 0.0 - 0.2 %   ____________________________________________  EKG My review and personal interpretation at Time: 17:59   Indication: sob  Rate: 100  Rhythm: sinus Axis: rightward Other: normal intervals, no stemi ____________________________________________  RADIOLOGY  I personally reviewed all radiographic images ordered to evaluate for the above acute complaints and reviewed radiology reports and findings.  These findings were  personally discussed with the patient.  Please see medical record for radiology report.  ____________________________________________   PROCEDURES  Procedure(s) performed:  .Critical Care Performed by: Willy Eddyobinson, Malloree Raboin, MD Authorized by: Willy Eddyobinson, Aivan Fillingim, MD   Critical care provider statement:    Critical care time (minutes):  30   Critical care time was exclusive of:  Separately billable procedures and treating other patients   Critical care was necessary to treat or prevent imminent or life-threatening deterioration of the following conditions:  Respiratory failure   Critical care was time spent personally by me on the following activities:  Development of treatment plan with patient or surrogate, discussions with consultants, evaluation of patient's response to treatment, examination of patient, obtaining history from patient or surrogate, ordering and performing treatments and interventions, ordering and review of laboratory studies, ordering and review of radiographic studies, pulse oximetry, re-evaluation of patient's condition and review of old charts      Critical Care performed: yes ____________________________________________   INITIAL IMPRESSION / ASSESSMENT AND PLAN / ED COURSE  Pertinent labs & imaging results that were available during my care of the patient were reviewed by me and considered in my medical decision making (see chart for details).   DDX: Asthma, copd, CHF, pna, ptx, malignancy, Pe, anemia   Joanette GulaGilberto Manjarrez Mora is a 33 y.o. who presents to the ED with symptoms as described above.  Patient's presentation certainly concerning for Covid associated pneumonia.  Patient does have evidence of acute hypoxic respiratory failure.  cxr with diffuse infiltrates .  Will give decadron, abx, remdesivir and albuterol.  Will discuss with hospitalist for admission.  Have  discussed with the patient and available family all diagnostics and treatments performed thus far  and all questions were answered to the best of my ability. The patient demonstrates understanding and agreement with plan.     The patient was evaluated in Emergency Department today for the symptoms described in the history of present illness. He/she was evaluated in the context of the global COVID-19 pandemic, which necessitated consideration that the patient might be at risk for infection with the SARS-CoV-2 virus that causes COVID-19. Institutional protocols and algorithms that pertain to the evaluation of patients at risk for COVID-19 are in a state of rapid change based on information released by regulatory bodies including the CDC and federal and state organizations. These policies and algorithms were followed during the patient's care in the ED.  As part of my medical decision making, I reviewed the following data within the electronic MEDICAL RECORD NUMBER Nursing notes reviewed and incorporated, Labs reviewed, notes from prior ED visits and [place] Controlled Substance Database   ____________________________________________   FINAL CLINICAL IMPRESSION(S) / ED DIAGNOSES  Final diagnoses:  Acute respiratory failure with hypoxia (HCC)  COVID-19 virus infection      NEW MEDICATIONS STARTED DURING THIS VISIT:  New Prescriptions   No medications on file     Note:  This document was prepared using Dragon voice recognition software and may include unintentional dictation errors.    Willy Eddy, MD 05/26/19 2258

## 2019-05-26 NOTE — ED Notes (Addendum)
Pt given water with verbal okay from [place]. HOB adjusted for pt. Pillow given.

## 2019-05-26 NOTE — ED Notes (Signed)
Pt given food tray and drink.  

## 2019-05-26 NOTE — ED Notes (Signed)
Report given to Kim, RN.

## 2019-05-26 NOTE — ED Triage Notes (Addendum)
Pt c/o increased SOB over the past week, states he started with a fever 12/20 and tested positive for covid 12/22. Pt is tachypnic on arrival. O2 sats are WNL. Pt states he is here with his wife who is also sick with similar sx..pt also states he has been having N/V/D.Marland Kitchen

## 2019-05-26 NOTE — ED Notes (Signed)
EKG completed as pt pointed to L side of chest c/o pain. Explained may be d/t heavy coughing but explained would like to capture EKG for reassurance.

## 2019-05-26 NOTE — Progress Notes (Signed)
Remdesivir - Pharmacy Brief Note   O:  ALT:  51 CXR:  SpO2: 97 % on RA    A/P:  Remdesivir 200 mg IVPB once followed by 100 mg IVPB daily x 4 days.   .me 05/26/2019 8:46 PM

## 2019-05-26 NOTE — ED Notes (Signed)
2nd set of cultures obtained from R ac by [place] NT.

## 2019-05-26 NOTE — ED Notes (Signed)
Pt tachypnic after episode of coughing.

## 2019-05-26 NOTE — ED Notes (Signed)
Pt up to bedside toilet.  

## 2019-05-26 NOTE — ED Notes (Signed)
Will give inhaler once received from pharm.

## 2019-05-27 ENCOUNTER — Encounter: Payer: Self-pay | Admitting: Internal Medicine

## 2019-05-27 DIAGNOSIS — R739 Hyperglycemia, unspecified: Secondary | ICD-10-CM

## 2019-05-27 DIAGNOSIS — R0902 Hypoxemia: Secondary | ICD-10-CM

## 2019-05-27 LAB — CBC WITH DIFFERENTIAL/PLATELET
Abs Immature Granulocytes: 0.03 10*3/uL (ref 0.00–0.07)
Basophils Absolute: 0 10*3/uL (ref 0.0–0.1)
Basophils Relative: 0 %
Eosinophils Absolute: 0 10*3/uL (ref 0.0–0.5)
Eosinophils Relative: 0 %
HCT: 45.1 % (ref 39.0–52.0)
Hemoglobin: 15.8 g/dL (ref 13.0–17.0)
Immature Granulocytes: 1 %
Lymphocytes Relative: 25 %
Lymphs Abs: 0.7 10*3/uL (ref 0.7–4.0)
MCH: 28.8 pg (ref 26.0–34.0)
MCHC: 35 g/dL (ref 30.0–36.0)
MCV: 82.1 fL (ref 80.0–100.0)
Monocytes Absolute: 0.2 10*3/uL (ref 0.1–1.0)
Monocytes Relative: 7 %
Neutro Abs: 1.8 10*3/uL (ref 1.7–7.7)
Neutrophils Relative %: 67 %
Platelets: 183 10*3/uL (ref 150–400)
RBC: 5.49 MIL/uL (ref 4.22–5.81)
RDW: 12.3 % (ref 11.5–15.5)
Smear Review: NORMAL
WBC: 2.8 10*3/uL — ABNORMAL LOW (ref 4.0–10.5)
nRBC: 0 % (ref 0.0–0.2)

## 2019-05-27 LAB — COMPREHENSIVE METABOLIC PANEL WITH GFR
ALT: 46 U/L — ABNORMAL HIGH (ref 0–44)
AST: 33 U/L (ref 15–41)
Albumin: 2.9 g/dL — ABNORMAL LOW (ref 3.5–5.0)
Alkaline Phosphatase: 73 U/L (ref 38–126)
Anion gap: 12 (ref 5–15)
BUN: 11 mg/dL (ref 6–20)
CO2: 20 mmol/L — ABNORMAL LOW (ref 22–32)
Calcium: 8.4 mg/dL — ABNORMAL LOW (ref 8.9–10.3)
Chloride: 103 mmol/L (ref 98–111)
Creatinine, Ser: 0.67 mg/dL (ref 0.61–1.24)
GFR calc Af Amer: >60 mL/min
GFR calc non Af Amer: >60 mL/min
Glucose, Bld: 350 mg/dL — ABNORMAL HIGH (ref 70–99)
Potassium: 4.6 mmol/L (ref 3.5–5.1)
Sodium: 135 mmol/L (ref 135–145)
Total Bilirubin: 1.1 mg/dL (ref 0.3–1.2)
Total Protein: 7.2 g/dL (ref 6.5–8.1)

## 2019-05-27 LAB — GLUCOSE, CAPILLARY
Glucose-Capillary: 321 mg/dL — ABNORMAL HIGH (ref 70–99)
Glucose-Capillary: 359 mg/dL — ABNORMAL HIGH (ref 70–99)
Glucose-Capillary: 331 mg/dL — ABNORMAL HIGH (ref 70–99)
Glucose-Capillary: 301 mg/dL — ABNORMAL HIGH (ref 70–99)
Glucose-Capillary: 314 mg/dL — ABNORMAL HIGH (ref 70–99)

## 2019-05-27 LAB — PHOSPHORUS: Phosphorus: 4.6 mg/dL (ref 2.5–4.6)

## 2019-05-27 LAB — HEMOGLOBIN A1C
Hgb A1c MFr Bld: 12.3 % — ABNORMAL HIGH (ref 4.8–5.6)
Mean Plasma Glucose: 306.31 mg/dL

## 2019-05-27 LAB — LACTIC ACID, PLASMA: Lactic Acid, Venous: 1.9 mmol/L (ref 0.5–1.9)

## 2019-05-27 LAB — ABO/RH: ABO/RH(D): O POS

## 2019-05-27 LAB — FERRITIN: Ferritin: 994 ng/mL — ABNORMAL HIGH (ref 24–336)

## 2019-05-27 LAB — HIV ANTIBODY (ROUTINE TESTING W REFLEX): HIV Screen 4th Generation wRfx: NONREACTIVE

## 2019-05-27 LAB — C-REACTIVE PROTEIN: CRP: 17.7 mg/dL — ABNORMAL HIGH (ref <1.0)

## 2019-05-27 LAB — MAGNESIUM: Magnesium: 2.2 mg/dL (ref 1.7–2.4)

## 2019-05-27 LAB — FIBRIN DERIVATIVES D-DIMER (ARMC ONLY): Fibrin derivatives D-dimer (ARMC): 1421.61 ng{FEU}/mL — ABNORMAL HIGH (ref 0.00–499.00)

## 2019-05-27 MED ORDER — INSULIN ASPART 100 UNIT/ML ~~LOC~~ SOLN
15.0000 [IU] | Freq: Once | SUBCUTANEOUS | Status: AC
  Administered 2019-05-27: 01:00:00 15 [IU] via SUBCUTANEOUS
  Filled 2019-05-27: qty 1

## 2019-05-27 MED ORDER — INSULIN ASPART 100 UNIT/ML ~~LOC~~ SOLN
0.0000 [IU] | Freq: Every day | SUBCUTANEOUS | Status: DC
  Administered 2019-05-27 – 2019-05-29 (×3): 4 [IU] via SUBCUTANEOUS
  Administered 2019-05-30: 22:00:00 3 [IU] via SUBCUTANEOUS
  Filled 2019-05-27 (×4): qty 1

## 2019-05-27 NOTE — Progress Notes (Signed)
Inpatient Diabetes Program Recommendations  AACE/ADA: New Consensus Statement on Inpatient Glycemic Control (2015)  Target Ranges:  Prepandial:   less than 140 mg/dL      Peak postprandial:   less than 180 mg/dL (1-2 hours)      Critically ill patients:  140 - 180 mg/dL   Results for Wesley Meyer, Wesley Meyer (MRN [id number]) as of 05/27/2019 14:19  Ref. 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   A1c Pending as of 2:20pm today   Will order educational materials and speak w/ pt by phone if definitive diagnosis of diabetes made.     MD- Please consider starting Lantus 20 units Daily (0.15 units/kg)     --Will follow patient during hospitalization--  Wyn Quaker RN, MSN, CDE Diabetes Coordinator Inpatient Glycemic Control Team Team Pager: [phone] (8a-5p)

## 2019-05-27 NOTE — Progress Notes (Signed)
PROGRESS NOTE    Wesley Meyer  OMV:[id number] DOB: [birth year] DOA: 05/26/2019 PCP: Patient, No Pcp Per    Assessment & Plan:   Active Problems:   Pneumonia due to COVID-19 virus   Hypoxia   Hyperglycemia    Wesley Meyer is a 33 y.o. male who is morbidly obese and with no significant past medical history who presented to the emergency room with a 1 week history of shortness of breath.  Developed a fever on 05/18/19 and tested positive for Covid on 05/20/2019.   #Acute hypoxic respiratory failure 2/2 Pneumonia due to COVID-19 virus --On presentation, tachypneic at 24 with oxygen saturation 88 on room air improving to 95% on O2 at 2 L.  X-ray showed airspace opacities consistent with pneumonia. PLAN: --continue dexamethasone and remdesivir, Day 2 --vitamins --Supplemental oxygen to keep sats over 90% --Follow inflammatory markers --Incentive spirometry and chest PT  # Hyperglycemia, without history of diabetes, suspect new onset type II diabetes --Sugar was 331 --Follow A1c  --SSI TID for now --Will need diabetic educator consult   DVT prophylaxis: Lovenox SQ Code Status: Full code  Family Communication: none today Disposition Plan: Home   Subjective and Interval History:  Pt reported coughing with sputum production.  Positive DOE.  No fever, chest pain, abdominal pain, N/V/D, dysuria, increased swelling.   Objective: Vitals:   05/26/19 2246 05/26/19 2328 05/27/19 0514 05/27/19 1140  BP:  (!) 138/93 123/82 121/79  Pulse:  96 73 81  Resp: (!) 30 (!) 23 17 20   Temp:  97.7 F (36.5 C) 97.8 F (36.6 C) 98.2 F (36.8 C)  TempSrc:  Oral Oral Oral  SpO2:  95% 98% 95%  Weight:      Height:        Intake/Output Summary (Last 24 hours) at 05/27/2019 1925 Last data filed at 05/27/2019 1700 Gross per 24 hour  Intake 886.09 ml  Output 3600 ml  Net -2713.91 ml   Filed Weights   05/26/19 1328  Weight: (!) 137.9 kg    Examination:    Constitutional: NAD, AAOx3 HEENT: conjunctivae and lids normal, EOMI CV: RRR no M,R,G. Distal pulses +2.  No cyanosis.   RESP: diffuse rhonchi, normal respiratory effort, on 2L GI: +BS, NTND Extremities: No effusions, edema, or tenderness in BLE MSK: no joint enlargement or tenderness of both UE and LE SKIN: warm, dry and intact Neuro: II - XII grossly intact.  Sensation intact Psych: Normal mood and affect.  Appropriate judgement and reason   Data Reviewed: I have personally reviewed following labs and imaging studies  CBC: Recent Labs  Lab 05/26/19 1334 05/26/19 2240 05/27/19 0611  WBC 4.8 3.9* 2.8*  NEUTROABS  --   --  1.8  HGB 16.4 15.5 15.8  HCT 48.0 45.0 45.1  MCV 84.5 84.0 82.1  PLT 162 155 628   Basic Metabolic Panel: Recent Labs  Lab 05/26/19 1334 05/26/19 1922 05/27/19 0611  NA 131* 131* 135  K 4.5 4.0 4.6  CL 98 99 103  CO2 22 20* 20*  GLUCOSE 331* 338* 350*  BUN 9 8 11   CREATININE 0.77 0.64 0.67  CALCIUM 8.6* 8.3* 8.4*  MG  --   --  2.2  PHOS  --   --  4.6   GFR: Estimated Creatinine Clearance: 187.2 mL/min (by C-G formula based on SCr of 0.67 mg/dL). Liver Function Tests: Recent Labs  Lab 05/26/19 1922 05/27/19 0611  AST 44* 33  ALT 51* 46*  ALKPHOS  78 73  BILITOT 1.0 1.1  PROT 7.2 7.2  ALBUMIN 3.2* 2.9*   No results for input(s): LIPASE, AMYLASE in the last 168 hours. No results for input(s): AMMONIA in the last 168 hours. Coagulation Profile: No results for input(s): INR, PROTIME in the last 168 hours. Cardiac Enzymes: No results for input(s): CKTOTAL, CKMB, CKMBINDEX, TROPONINI in the last 168 hours. BNP (last 3 results) No results for input(s): PROBNP in the last 8760 hours. HbA1C: No results for input(s): HGBA1C in the last 72 hours. CBG: Recent Labs  Lab 05/26/19 2357 05/27/19 0735 05/27/19 1137 05/27/19 1640  GLUCAP 359* 321* 331* 301*   Lipid Profile: Recent Labs    05/26/19 1922  TRIG 84   Thyroid Function  Tests: No results for input(s): TSH, T4TOTAL, FREET4, T3FREE, THYROIDAB in the last 72 hours. Anemia Panel: Recent Labs    05/26/19 1922 05/27/19 0611  FERRITIN 928* 994*   Sepsis Labs: Recent Labs  Lab 05/26/19 1922 05/27/19 0007  PROCALCITON <0.10  --   LATICACIDVEN 1.6 1.9    Recent Results (from the past 240 hour(s))  Blood Culture (routine x 2)     Status: None (Preliminary result)   Collection Time: 05/26/19  7:23 PM   Specimen: BLOOD  Result Value Ref Range Status   Specimen Description BLOOD BLOOD LEFT HAND  Final   Special Requests   Final    BOTTLES DRAWN AEROBIC AND ANAEROBIC Blood Culture adequate volume   Culture   Final    NO GROWTH < 12 HOURS Performed at [facility], [address] [place], [place] [address]    Report Status PENDING  Incomplete  Blood Culture (routine x 2)     Status: None (Preliminary result)   Collection Time: 05/26/19  7:23 PM   Specimen: BLOOD  Result Value Ref Range Status   Specimen Description BLOOD RIGHT ANTECUBITAL  Final   Special Requests   Final    BOTTLES DRAWN AEROBIC AND ANAEROBIC Blood Culture adequate volume   Culture   Final    NO GROWTH < 12 HOURS Performed at [facility], [address] [place], [place] [address]    Report Status PENDING  Incomplete      Radiology Studies: DG Chest 2 View  Result Date: 05/26/2019 CLINICAL DATA:  Shortness of breath. Additional provided: Shortness of breath and cough for 2 days. EXAM: CHEST - 2 VIEW COMPARISON:  No pertinent prior studies available for comparison. FINDINGS: Shallow inspiration radiograph with accentuation of the cardiac silhouette and crowding of the central bronchovascular markings. The heart does not appear enlarged. Extensive ill-defined opacities throughout both lungs. No evidence of pleural effusion or pneumothorax. No acute bony abnormality. IMPRESSION: Shallow inspiration radiograph. Extensive ill-defined airspace opacities throughout  both lungs are nonspecific, but consistent with pneumonia in the appropriate clinical setting. Clinical correlation and radiographic follow-up to resolution recommended. Electronically Signed   By: Jackey LogeKyle  Golden DO   On: 05/26/2019 14:04     Scheduled Meds: . albuterol  2 puff Inhalation Q6H  . vitamin C  500 mg Oral Daily  . dexamethasone  6 mg Oral Q24H  . enoxaparin (LOVENOX) injection  40 mg Subcutaneous BID  . insulin aspart  0-15 Units Subcutaneous TID WC  . multivitamin with minerals  1 tablet Oral Daily  . zinc sulfate  220 mg Oral Daily   Continuous Infusions: . sodium chloride Stopped (05/27/19 0110)  . azithromycin Stopped (05/26/19 2229)  . cefTRIAXone (ROCEPHIN)  IV  Stopped (05/26/19 2101)  . remdesivir 100 mg in NS 100 mL Stopped (05/27/19 0945)     LOS: 1 day     Darlin Priestly, MD [facility] If 7PM-7AM, please contact night-coverage 05/27/2019, 7:25 PM

## 2019-05-28 LAB — COMPREHENSIVE METABOLIC PANEL
ALT: 40 U/L (ref 0–44)
AST: 26 U/L (ref 15–41)
Albumin: 2.8 g/dL — ABNORMAL LOW (ref 3.5–5.0)
Alkaline Phosphatase: 76 U/L (ref 38–126)
Anion gap: 10 (ref 5–15)
BUN: 14 mg/dL (ref 6–20)
CO2: 21 mmol/L — ABNORMAL LOW (ref 22–32)
Calcium: 8.3 mg/dL — ABNORMAL LOW (ref 8.9–10.3)
Chloride: 103 mmol/L (ref 98–111)
Creatinine, Ser: 0.54 mg/dL — ABNORMAL LOW (ref 0.61–1.24)
GFR calc Af Amer: >60 mL/min (ref >60)
GFR calc non Af Amer: >60 mL/min (ref >60)
Glucose, Bld: 332 mg/dL — ABNORMAL HIGH (ref 70–99)
Potassium: 4.3 mmol/L (ref 3.5–5.1)
Sodium: 134 mmol/L — ABNORMAL LOW (ref 135–145)
Total Bilirubin: 1 mg/dL (ref 0.3–1.2)
Total Protein: 6.7 g/dL (ref 6.5–8.1)

## 2019-05-28 LAB — CBC WITH DIFFERENTIAL/PLATELET
Abs Immature Granulocytes: 0.03 10*3/uL (ref 0.00–0.07)
Basophils Absolute: 0 10*3/uL (ref 0.0–0.1)
Basophils Relative: 0 %
Eosinophils Absolute: 0 10*3/uL (ref 0.0–0.5)
Eosinophils Relative: 0 %
HCT: 44.5 % (ref 39.0–52.0)
Hemoglobin: 15.1 g/dL (ref 13.0–17.0)
Immature Granulocytes: 1 %
Lymphocytes Relative: 21 %
Lymphs Abs: 0.8 10*3/uL (ref 0.7–4.0)
MCH: 28.7 pg (ref 26.0–34.0)
MCHC: 33.9 g/dL (ref 30.0–36.0)
MCV: 84.4 fL (ref 80.0–100.0)
Monocytes Absolute: 0.3 10*3/uL (ref 0.1–1.0)
Monocytes Relative: 8 %
Neutro Abs: 2.7 10*3/uL (ref 1.7–7.7)
Neutrophils Relative %: 70 %
Platelets: 217 10*3/uL (ref 150–400)
RBC: 5.27 MIL/uL (ref 4.22–5.81)
RDW: 12.2 % (ref 11.5–15.5)
WBC: 3.9 10*3/uL — ABNORMAL LOW (ref 4.0–10.5)
nRBC: 0 % (ref 0.0–0.2)

## 2019-05-28 LAB — GLUCOSE, CAPILLARY
Glucose-Capillary: 340 mg/dL — ABNORMAL HIGH (ref 70–99)
Glucose-Capillary: 339 mg/dL — ABNORMAL HIGH (ref 70–99)
Glucose-Capillary: 365 mg/dL — ABNORMAL HIGH (ref 70–99)

## 2019-05-28 LAB — HEMOGLOBIN A1C
Hgb A1c MFr Bld: 11.5 % — ABNORMAL HIGH (ref 4.8–5.6)
Mean Plasma Glucose: 283.35 mg/dL

## 2019-05-28 LAB — FERRITIN: Ferritin: 741 ng/mL — ABNORMAL HIGH (ref 24–336)

## 2019-05-28 LAB — C-REACTIVE PROTEIN: CRP: 8.7 mg/dL — ABNORMAL HIGH (ref <1.0)

## 2019-05-28 LAB — PHOSPHORUS: Phosphorus: 3.4 mg/dL (ref 2.5–4.6)

## 2019-05-28 LAB — FIBRIN DERIVATIVES D-DIMER (ARMC ONLY): Fibrin derivatives D-dimer (ARMC): 1131.96 ng/mL (FEU) — ABNORMAL HIGH (ref 0.00–499.00)

## 2019-05-28 LAB — MAGNESIUM: Magnesium: 2.1 mg/dL (ref 1.7–2.4)

## 2019-05-28 MED ORDER — LIVING WELL WITH DIABETES BOOK
Freq: Once | Status: AC
  Filled 2019-05-28: qty 1

## 2019-05-28 MED ORDER — INSULIN ASPART 100 UNIT/ML ~~LOC~~ SOLN
4.0000 [IU] | Freq: Three times a day (TID) | SUBCUTANEOUS | Status: DC
  Administered 2019-05-28: 17:00:00 4 [IU] via SUBCUTANEOUS
  Filled 2019-05-28: qty 1

## 2019-05-28 MED ORDER — INSULIN GLARGINE 100 UNIT/ML ~~LOC~~ SOLN
20.0000 [IU] | Freq: Every day | SUBCUTANEOUS | Status: DC
  Administered 2019-05-28: 20 [IU] via SUBCUTANEOUS
  Filled 2019-05-28 (×2): qty 0.2

## 2019-05-28 MED ORDER — INSULIN STARTER KIT- PEN NEEDLES (ENGLISH)
1.0000 | Freq: Once | Status: AC
  Administered 2019-05-28: 1
  Filled 2019-05-28: qty 1

## 2019-05-28 NOTE — Progress Notes (Signed)
PROGRESS NOTE    Wesley Meyer  KGU:[id number] DOB: [birth year] DOA: 05/26/2019 PCP: Patient, No Pcp Per   Brief Narrative:  Wesley Meyer a 33 y.o.malewho is morbidly obese and with no significant past medical history who presented to the emergency room with a 1 week history of shortness of breath. Developed a fever on 12/20/20and tested positive for Covid on 05/20/2019.  12/30: No status changes over interval.  Patient appears somewhat lethargic.  Endorses fatigue.  Remains on 2 L nasal cannula  Assessment & Plan:   Active Problems:   Pneumonia due to COVID-19 virus   Hypoxia   Hyperglycemia  #Acute hypoxic respiratory failure 2/2 Pneumonia due to COVID-19 virus --On presentation, tachypneic at 24 with oxygen saturation 88 on room air improving to 95% on O2 at 2 L.  X-ray showed airspace opacities consistent with pneumonia. PLAN: --continue dexamethasone and remdesivir, Day 3 --vitamins --Supplemental oxygen to keep sats over 90% --Follow inflammatory markers --Incentive spirometry and chest PT  # Hyperglycemia, without history of diabetes, suspect new onset type II diabetes Sugars remain elevated Diabetes coordinator follow-up, recommendations appreciated Insulin regimen as ordered  Stage III obesity, BMI 41.32 Counseled patient Dietitian consult  DVT prophylaxis: Lovenox SQ Code Status: Full code  Family Communication: none today Disposition Plan: Home     Consultants:   None  Procedures: None Antimicrobials: Remdesivir (05/26/2019- )  Subjective: Seen and examined Endorse fatigue No acute events  Objective: Vitals:   05/27/19 1140 05/27/19 2058 05/28/19 0428 05/28/19 1137  BP: 121/79 126/78 124/85 120/83  Pulse: 81 82 74 63  Resp: 20 18 18  (!) 23  Temp: 98.2 F (36.8 C) 97.8 F (36.6 C) 97.7 F (36.5 C) 97.7 F (36.5 C)  TempSrc: Oral Oral Oral Oral  SpO2: 95% 96% 94% 96%  Weight:      Height:         Intake/Output Summary (Last 24 hours) at 05/28/2019 1401 Last data filed at 05/28/2019 0433 Gross per 24 hour  Intake --  Output 3750 ml  Net -3750 ml   Filed Weights   05/26/19 1328  Weight: (!) 137.9 kg    Examination:   Constitutional: NAD, AAOx3 HEENT: conjunctivae and lids normal, EOMI CV: RRR no M,R,G. Distal pulses +2.  No cyanosis.   RESP: diffuse rhonchi, normal respiratory effort, on 2L GI: +BS, NTND Extremities: No effusions, edema, or tenderness in BLE MSK: no joint enlargement or tenderness of both UE and LE SKIN: warm, dry and intact Neuro: II - XII grossly intact.  Sensation intact Psych: Normal mood and affect.  Appropriate judgement and reason   Data Reviewed: I have personally reviewed following labs and imaging studies  CBC: Recent Labs  Lab 05/26/19 1334 05/26/19 2240 05/27/19 0611 05/28/19 0338  WBC 4.8 3.9* 2.8* 3.9*  NEUTROABS  --   --  1.8 2.7  HGB 16.4 15.5 15.8 15.1  HCT 48.0 45.0 45.1 44.5  MCV 84.5 84.0 82.1 84.4  PLT 162 155 183 628   Basic Metabolic Panel: Recent Labs  Lab 05/26/19 1334 05/26/19 1922 05/27/19 0611 05/28/19 0338  NA 131* 131* 135 134*  K 4.5 4.0 4.6 4.3  CL 98 99 103 103  CO2 22 20* 20* 21*  GLUCOSE 331* 338* 350* 332*  BUN 9 8 11 14   CREATININE 0.77 0.64 0.67 0.54*  CALCIUM 8.6* 8.3* 8.4* 8.3*  MG  --   --  2.2 2.1  PHOS  --   --  4.6  3.4   GFR: Estimated Creatinine Clearance: 187.2 mL/min (A) (by C-G formula based on SCr of 0.54 mg/dL (L)). Liver Function Tests: Recent Labs  Lab 05/26/19 1922 05/27/19 0611 05/28/19 0338  AST 44* 33 26  ALT 51* 46* 40  ALKPHOS 78 73 76  BILITOT 1.0 1.1 1.0  PROT 7.2 7.2 6.7  ALBUMIN 3.2* 2.9* 2.8*   No results for input(s): LIPASE, AMYLASE in the last 168 hours. No results for input(s): AMMONIA in the last 168 hours. Coagulation Profile: No results for input(s): INR, PROTIME in the last 168 hours. Cardiac Enzymes: No results for input(s): CKTOTAL,  CKMB, CKMBINDEX, TROPONINI in the last 168 hours. BNP (last 3 results) No results for input(s): PROBNP in the last 8760 hours. HbA1C: Recent Labs    05/26/19 1334 05/26/19 2240  HGBA1C 11.5* 12.3*   CBG: Recent Labs  Lab 05/27/19 1137 05/27/19 1640 05/27/19 2056 05/28/19 0808 05/28/19 1134  GLUCAP 331* 301* 314* 340* 339*   Lipid Profile: Recent Labs    05/26/19 1922  TRIG 84   Thyroid Function Tests: No results for input(s): TSH, T4TOTAL, FREET4, T3FREE, THYROIDAB in the last 72 hours. Anemia Panel: Recent Labs    05/27/19 0611 05/28/19 0338  FERRITIN 994* 741*   Sepsis Labs: Recent Labs  Lab 05/26/19 1922 05/27/19 0007  PROCALCITON <0.10  --   LATICACIDVEN 1.6 1.9    Recent Results (from the past 240 hour(s))  Blood Culture (routine x 2)     Status: None (Preliminary result)   Collection Time: 05/26/19  7:23 PM   Specimen: BLOOD  Result Value Ref Range Status   Specimen Description BLOOD BLOOD LEFT HAND  Final   Special Requests   Final    BOTTLES DRAWN AEROBIC AND ANAEROBIC Blood Culture adequate volume   Culture   Final    NO GROWTH < 12 HOURS Performed at [facility], [address] [place], [place] [address]    Report Status PENDING  Incomplete  Blood Culture (routine x 2)     Status: None (Preliminary result)   Collection Time: 05/26/19  7:23 PM   Specimen: BLOOD  Result Value Ref Range Status   Specimen Description BLOOD RIGHT ANTECUBITAL  Final   Special Requests   Final    BOTTLES DRAWN AEROBIC AND ANAEROBIC Blood Culture adequate volume   Culture   Final    NO GROWTH < 12 HOURS Performed at [facility], [address] [place], [place] [address]    Report Status PENDING  Incomplete         Radiology Studies: No results found.      Scheduled Meds: . albuterol  2 puff Inhalation Q6H  . vitamin C  500 mg Oral Daily  . dexamethasone  6 mg Oral Q24H  . enoxaparin (LOVENOX) injection  40 mg Subcutaneous  BID  . insulin aspart  0-15 Units Subcutaneous TID WC  . insulin aspart  0-5 Units Subcutaneous QHS  . insulin aspart  4 Units Subcutaneous TID WC  . insulin glargine  20 Units Subcutaneous Daily  . multivitamin with minerals  1 tablet Oral Daily  . zinc sulfate  220 mg Oral Daily   Continuous Infusions: . sodium chloride 500 mL (05/27/19 2034)  . azithromycin Stopped (05/28/19 0700)  . cefTRIAXone (ROCEPHIN)  IV Stopped (05/28/19 0700)  . remdesivir 100 mg in NS 100 mL 100 mg (05/28/19 0922)     LOS: 2 days    Time spent: 35  minutes     Tresa Moore, MD [facility] Pager [phone]  If 7PM-7AM, please contact night-coverage [email or web address] Password [email or web address] 05/28/2019, 2:01 PM

## 2019-05-28 NOTE — Progress Notes (Addendum)
Inpatient Diabetes Program Recommendations  AACE/ADA: New Consensus Statement on Inpatient Glycemic Control (2015)  Target Ranges:  Prepandial:   less than 140 mg/dL      Peak postprandial:   less than 180 mg/dL (1-2 hours)      Critically ill patients:  140 - 180 mg/dL   Lab Results  Component Value Date   GLUCAP 339 (H) 05/28/2019   HGBA1C 12.3 (H) 05/26/2019    Review of Glycemic Control Results for Wesley Meyer, Wesley Meyer (MRN [id number]) as of 05/28/2019 13:29  Ref. Range 05/27/2019 16:40 05/27/2019 20:56 05/28/2019 03:38 05/28/2019 08:08 05/28/2019 11:34  Glucose-Capillary Latest Ref Range: 70 - 99 mg/dL 301 (H) 314 (H)  340 (H) 339 (H)    Diabetes history: New DM2 Outpatient Diabetes medications: None Current orders for Inpatient glycemic control: Lantus 20 units daily; Novolog 0-15 TID with meals; 0-5 at bedtime  Inpatient Diabetes Program Recommendations:     Please consider- -Novolog 4 units TID with meals if eats at least 50% of meal  Addendum@1549 -Spoke with patient on the phone.  Reviewed patient's current A1c of 12.3%. Explained what a A1c is and what it measures. Also reviewed goal A1c with patient, importance of good glucose control @ home, and blood sugar goals.  He states he was diagnosed with DM 1.5 yrs ago and was prescribed Metformin but decided not to take it.  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  He is not sure if he has prescription coverage.  He had a physical 8 months ago and his A1c was 6.9.  He states he hasn't been doing anything different.  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  Educated patient on carbohydrates and importance of limiting them and cutting out drinks with sugar.  Discussed need for insulin at discharge and he states he wants to avoid that.  He checks his BS occasionally.  Ordered living well with DM and insulin pen starter kit.  Nursing staff please educate patient on insulin injections  and allow patient to self administer.  Discussed hypoglycemia and treatment.  Will continue to follow.  Thank you, Geoffry Paradise, RN, BSN Diabetes Coordinator Inpatient Diabetes Program [phone] (team pager from 8a-5p)     Thank you, Geoffry Paradise, RN, BSN Diabetes Coordinator Inpatient Diabetes Program [phone] (team pager from 8a-5p)

## 2019-05-28 NOTE — Progress Notes (Signed)
Discussed with Juliette Mangle, about patient not eating much. He ate some breakfast no lunch but then ate all of his supper. She ordered a DM book and insulin kit for the patient. At supper I went in to give his insulin, he does not like needles and did not want to talk about the insulin or administering it. I told him he may have to go home with it and it was very important for him to know how to administer it. I showed this patient the procedure for giving insulin but he did not want to do it himself. Will pass on to next shift he needs continued education and support.

## 2019-05-29 LAB — FERRITIN: Ferritin: 695 ng/mL — ABNORMAL HIGH (ref 24–336)

## 2019-05-29 LAB — GLUCOSE, CAPILLARY
Glucose-Capillary: 314 mg/dL — ABNORMAL HIGH (ref 70–99)
Glucose-Capillary: 410 mg/dL — ABNORMAL HIGH (ref 70–99)
Glucose-Capillary: 343 mg/dL — ABNORMAL HIGH (ref 70–99)
Glucose-Capillary: 309 mg/dL — ABNORMAL HIGH (ref 70–99)
Glucose-Capillary: 344 mg/dL — ABNORMAL HIGH (ref 70–99)

## 2019-05-29 LAB — CBC WITH DIFFERENTIAL/PLATELET
Abs Immature Granulocytes: 0.02 10*3/uL (ref 0.00–0.07)
Basophils Absolute: 0 10*3/uL (ref 0.0–0.1)
Basophils Relative: 0 %
Eosinophils Absolute: 0 10*3/uL (ref 0.0–0.5)
Eosinophils Relative: 0 %
HCT: 44.1 % (ref 39.0–52.0)
Hemoglobin: 15.6 g/dL (ref 13.0–17.0)
Immature Granulocytes: 1 %
Lymphocytes Relative: 17 %
Lymphs Abs: 0.6 10*3/uL — ABNORMAL LOW (ref 0.7–4.0)
MCH: 28.7 pg (ref 26.0–34.0)
MCHC: 35.4 g/dL (ref 30.0–36.0)
MCV: 81.2 fL (ref 80.0–100.0)
Monocytes Absolute: 0.3 10*3/uL (ref 0.1–1.0)
Monocytes Relative: 8 %
Neutro Abs: 2.7 10*3/uL (ref 1.7–7.7)
Neutrophils Relative %: 74 %
Platelets: 241 10*3/uL (ref 150–400)
RBC: 5.43 MIL/uL (ref 4.22–5.81)
RDW: 12.2 % (ref 11.5–15.5)
WBC: 3.6 10*3/uL — ABNORMAL LOW (ref 4.0–10.5)
nRBC: 0 % (ref 0.0–0.2)

## 2019-05-29 LAB — COMPREHENSIVE METABOLIC PANEL
ALT: 33 U/L (ref 0–44)
AST: 19 U/L (ref 15–41)
Albumin: 2.7 g/dL — ABNORMAL LOW (ref 3.5–5.0)
Alkaline Phosphatase: 64 U/L (ref 38–126)
Anion gap: 9 (ref 5–15)
BUN: 15 mg/dL (ref 6–20)
CO2: 23 mmol/L (ref 22–32)
Calcium: 8.2 mg/dL — ABNORMAL LOW (ref 8.9–10.3)
Chloride: 103 mmol/L (ref 98–111)
Creatinine, Ser: 0.48 mg/dL — ABNORMAL LOW (ref 0.61–1.24)
GFR calc Af Amer: >60 mL/min (ref >60)
GFR calc non Af Amer: >60 mL/min (ref >60)
Glucose, Bld: 360 mg/dL — ABNORMAL HIGH (ref 70–99)
Potassium: 4.4 mmol/L (ref 3.5–5.1)
Sodium: 135 mmol/L (ref 135–145)
Total Bilirubin: 1 mg/dL (ref 0.3–1.2)
Total Protein: 6.7 g/dL (ref 6.5–8.1)

## 2019-05-29 LAB — FIBRIN DERIVATIVES D-DIMER (ARMC ONLY): Fibrin derivatives D-dimer (ARMC): 688.64 ng/mL (FEU) — ABNORMAL HIGH (ref 0.00–499.00)

## 2019-05-29 LAB — PHOSPHORUS: Phosphorus: 4 mg/dL (ref 2.5–4.6)

## 2019-05-29 LAB — MAGNESIUM: Magnesium: 2 mg/dL (ref 1.7–2.4)

## 2019-05-29 LAB — C-REACTIVE PROTEIN: CRP: 3.2 mg/dL — ABNORMAL HIGH (ref <1.0)

## 2019-05-29 MED ORDER — INSULIN ASPART 100 UNIT/ML ~~LOC~~ SOLN
0.0000 [IU] | Freq: Three times a day (TID) | SUBCUTANEOUS | Status: DC
  Administered 2019-05-29 (×2): 11 [IU] via SUBCUTANEOUS
  Administered 2019-05-30 (×2): 8 [IU] via SUBCUTANEOUS
  Administered 2019-05-30: 09:00:00 11 [IU] via SUBCUTANEOUS
  Administered 2019-05-31 (×2): 15 [IU] via SUBCUTANEOUS
  Filled 2019-05-29 (×7): qty 1

## 2019-05-29 MED ORDER — INSULIN GLARGINE 100 UNIT/ML ~~LOC~~ SOLN
28.0000 [IU] | Freq: Every day | SUBCUTANEOUS | Status: DC
  Administered 2019-05-29 – 2019-05-31 (×3): 28 [IU] via SUBCUTANEOUS
  Filled 2019-05-29 (×4): qty 0.28

## 2019-05-29 MED ORDER — INSULIN ASPART 100 UNIT/ML ~~LOC~~ SOLN
20.0000 [IU] | Freq: Once | SUBCUTANEOUS | Status: AC
  Administered 2019-05-29: 09:00:00 20 [IU] via SUBCUTANEOUS
  Filled 2019-05-29: qty 1

## 2019-05-29 MED ORDER — NEPRO/CARBSTEADY PO LIQD
237.0000 mL | Freq: Three times a day (TID) | ORAL | Status: DC
  Administered 2019-05-29 – 2019-05-31 (×7): 237 mL via ORAL

## 2019-05-29 MED ORDER — INSULIN ASPART 100 UNIT/ML ~~LOC~~ SOLN
4.0000 [IU] | Freq: Three times a day (TID) | SUBCUTANEOUS | Status: DC
  Administered 2019-05-29 – 2019-05-31 (×7): 4 [IU] via SUBCUTANEOUS
  Filled 2019-05-29 (×7): qty 1

## 2019-05-29 NOTE — Progress Notes (Signed)
Notified Dr. Priscella Mann patients glucose is 410. He ordered to give 20 units of insulin.

## 2019-05-29 NOTE — Progress Notes (Signed)
Inpatient Diabetes Program Recommendations  AACE/ADA: New Consensus Statement on Inpatient Glycemic Control (2015)  Target Ranges:  Prepandial:   less than 140 mg/dL      Peak postprandial:   less than 180 mg/dL (1-2 hours)      Critically ill patients:  140 - 180 mg/dL   Lab Results  Component Value Date   GLUCAP 410 (H) 05/29/2019   HGBA1C 12.3 (H) 05/26/2019    Review of Glycemic Control  Results for GUINN, DELAROSA (MRN [id number]) as of 05/29/2019 08:14  Ref. Range 05/28/2019 08:08 05/28/2019 11:34 05/28/2019 16:43 05/28/2019 22:14 05/29/2019 07:57  Glucose-Capillary Latest Ref Range: 70 - 99 mg/dL 340 (H)  NOVOLOG 11units 339 (H)  NOVOLOG 11units 365 (H)  NOVOLOG 19units 314 (H)  NOVOLOG 4units + Decadron 6 mg 410 (H)       Diabetes history: DM2 Outpatient Diabetes medications: NONE Current orders for Inpatient glycemic control: Novolog 0-15 TID, 0-5 at bedtime; Novolog 4 units TID with meals; Lantus 20 units daily  Inpatient Diabetes Program Recommendations:     -Lantus 28 units daily (0.2/kg) -Novolog 0-20 TID with meals  -Spoke with Jodie RN, she reported last evening patient is not eating well  Thank you, Geoffry Paradise, RN, BSN Diabetes Coordinator Inpatient Diabetes Program [phone] (team pager from 8a-5p)

## 2019-05-29 NOTE — Progress Notes (Signed)
PROGRESS NOTE    Wesley Meyer  JJH:[id number] DOB: January 05, 1986 DOA: 05/26/2019 PCP: Patient, No Pcp Per   Brief Narrative:  Wesley Meyer a 33 y.o.malewho is morbidly obese and with no significant past medical history who presented to the emergency room with a 1 week history of shortness of breath. Developed a fever on 12/20/20and tested positive for Covid on 05/20/2019.  12/30: No status changes over interval.  Patient appears somewhat lethargic.  Endorses fatigue.  Remains on 2 L nasal cannula  12/31: No acute status changes.  Hyperglycemia remains refractory and difficult to control.  Patient is reluctant to taking insulin as outpatient as he feels that it will impact his job.  Remains on 2 L nasal cannula.  Assessment & Plan:   Active Problems:   Pneumonia due to COVID-19 virus   Hypoxia   Hyperglycemia  #Acute hypoxic respiratory failure 2/2 Pneumonia due to COVID-19 virus --On presentation, tachypneic at 24 with oxygen saturation 88 on room air improving to 95% on O2 at 2 L.  X-ray showed airspace opacities consistent with pneumonia. PLAN: --continue dexamethasone and remdesivir, Day 4 --vitamins --Supplemental oxygen to keep sats over 90% --Follow inflammatory markers --Incentive spirometry and chest PT  # Diabetes type 2, new diagnosis, uncontrolled Glycemic control remains a challenge And globin A1c 12.7 Aggressive Lantus and scheduled lispro regimen Sliding scale insulin coverage Carb controlled diet Diabetes coordinator following, recs appreciated  Stage III obesity, BMI 41.32 Counseled patient Dietitian consult  DVT prophylaxis: Lovenox SQ Code Status: Full code  Family Communication: none today Disposition Plan: Home     Consultants:   None  Procedures: None Antimicrobials: Remdesivir (05/26/2019- )  Subjective: Seen and examined Endorse fatigue No new complaints  Objective: Vitals:   05/28/19 1137 05/28/19 2226  05/29/19 0534 05/29/19 1159  BP: 120/83 118/79 118/79 115/75  Pulse: 63 70 69 78  Resp: (!) 23 20 20  (!) 24  Temp: 97.7 F (36.5 C) 97.7 F (36.5 C) 98.2 F (36.8 C) (!) 97.5 F (36.4 C)  TempSrc: Oral Oral Oral Oral  SpO2: 96% 97% 95% 98%  Weight:      Height:        Intake/Output Summary (Last 24 hours) at 05/29/2019 1223 Last data filed at 05/29/2019 0546 Gross per 24 hour  Intake 555.89 ml  Output 1700 ml  Net -1144.11 ml   Filed Weights   05/26/19 1328  Weight: (!) 137.9 kg    Examination:   Constitutional: NAD, AAOx3 HEENT: conjunctivae and lids normal, EOMI CV: RRR no M,R,G. Distal pulses +2.  No cyanosis.   RESP: diffuse rhonchi, normal respiratory effort, on 2L GI: +BS, NTND Extremities: No effusions, edema, or tenderness in BLE MSK: no joint enlargement or tenderness of both UE and LE SKIN: warm, dry and intact Neuro: II - XII grossly intact.  Sensation intact Psych: Normal mood and affect.  Appropriate judgement and reason   Data Reviewed: I have personally reviewed following labs and imaging studies  CBC: Recent Labs  Lab 05/26/19 1334 05/26/19 2240 05/27/19 0611 05/28/19 0338 05/29/19 0649  WBC 4.8 3.9* 2.8* 3.9* 3.6*  NEUTROABS  --   --  1.8 2.7 2.7  HGB 16.4 15.5 15.8 15.1 15.6  HCT 48.0 45.0 45.1 44.5 44.1  MCV 84.5 84.0 82.1 84.4 81.2  PLT 162 155 183 217 241   Basic Metabolic Panel: Recent Labs  Lab 05/26/19 1334 05/26/19 1922 05/27/19 0611 05/28/19 0338 05/29/19 0649  NA 131* 131* 135  134* 135  K 4.5 4.0 4.6 4.3 4.4  CL 98 99 103 103 103  CO2 22 20* 20* 21* 23  GLUCOSE 331* 338* 350* 332* 360*  BUN 9 8 11 14 15   CREATININE 0.77 0.64 0.67 0.54* 0.48*  CALCIUM 8.6* 8.3* 8.4* 8.3* 8.2*  MG  --   --  2.2 2.1 2.0  PHOS  --   --  4.6 3.4 4.0   GFR: Estimated Creatinine Clearance: 187.2 mL/min (A) (by C-G formula based on SCr of 0.48 mg/dL (L)). Liver Function Tests: Recent Labs  Lab 05/26/19 1922 05/27/19 0611  05/28/19 0338 05/29/19 0649  AST 44* 33 26 19  ALT 51* 46* 40 33  ALKPHOS 78 73 76 64  BILITOT 1.0 1.1 1.0 1.0  PROT 7.2 7.2 6.7 6.7  ALBUMIN 3.2* 2.9* 2.8* 2.7*   No results for input(s): LIPASE, AMYLASE in the last 168 hours. No results for input(s): AMMONIA in the last 168 hours. Coagulation Profile: No results for input(s): INR, PROTIME in the last 168 hours. Cardiac Enzymes: No results for input(s): CKTOTAL, CKMB, CKMBINDEX, TROPONINI in the last 168 hours. BNP (last 3 results) No results for input(s): PROBNP in the last 8760 hours. HbA1C: Recent Labs    05/26/19 1334 05/26/19 2240  HGBA1C 11.5* 12.3*   CBG: Recent Labs  Lab 05/28/19 1134 05/28/19 1643 05/28/19 2214 05/29/19 0757 05/29/19 1200  GLUCAP 339* 365* 314* 410* 343*   Lipid Profile: Recent Labs    05/26/19 1922  TRIG 84   Thyroid Function Tests: No results for input(s): TSH, T4TOTAL, FREET4, T3FREE, THYROIDAB in the last 72 hours. Anemia Panel: Recent Labs    05/28/19 0338 05/29/19 0649  FERRITIN 741* 695*   Sepsis Labs: Recent Labs  Lab 05/26/19 1922 05/27/19 0007  PROCALCITON <0.10  --   LATICACIDVEN 1.6 1.9    Recent Results (from the past 240 hour(s))  Blood Culture (routine x 2)     Status: None (Preliminary result)   Collection Time: 05/26/19  7:23 PM   Specimen: BLOOD  Result Value Ref Range Status   Specimen Description BLOOD BLOOD LEFT HAND  Final   Special Requests   Final    BOTTLES DRAWN AEROBIC AND ANAEROBIC Blood Culture adequate volume   Culture   Final    NO GROWTH 3 DAYS Performed at [facility], [address] [place], [place] [address]    Report Status PENDING  Incomplete  Blood Culture (routine x 2)     Status: None (Preliminary result)   Collection Time: 05/26/19  7:23 PM   Specimen: BLOOD  Result Value Ref Range Status   Specimen Description BLOOD RIGHT ANTECUBITAL  Final   Special Requests   Final    BOTTLES DRAWN AEROBIC AND ANAEROBIC  Blood Culture adequate volume   Culture   Final    NO GROWTH 3 DAYS Performed at [facility], [address] [place], [place] [address]    Report Status PENDING  Incomplete         Radiology Studies: No results found.      Scheduled Meds: . albuterol  2 puff Inhalation Q6H  . vitamin C  500 mg Oral Daily  . dexamethasone  6 mg Oral Q24H  . enoxaparin (LOVENOX) injection  40 mg Subcutaneous BID  . feeding supplement (NEPRO CARB STEADY)  237 mL Oral TID BM  . insulin aspart  0-20 Units Subcutaneous TID WC  . insulin aspart  0-5 Units Subcutaneous  QHS  . insulin aspart  4 Units Subcutaneous TID WC  . insulin glargine  28 Units Subcutaneous Daily  . multivitamin with minerals  1 tablet Oral Daily  . zinc sulfate  220 mg Oral Daily   Continuous Infusions: . sodium chloride 500 mL (05/28/19 2220)  . azithromycin Stopped (05/29/19 0700)  . cefTRIAXone (ROCEPHIN)  IV Stopped (05/29/19 0700)  . remdesivir 100 mg in NS 100 mL 100 mg (05/29/19 1044)     LOS: 3 days    Time spent: 35 minutes     Sidney Ace, MD [facility] Pager [phone]  If 7PM-7AM, please contact night-coverage [email or web address] Password [facility] 05/29/2019, 12:23 PM

## 2019-05-29 NOTE — Progress Notes (Addendum)
Initial Nutrition Assessment  DOCUMENTATION CODES:   Morbid obesity  INTERVENTION:   Nepro Shake po TID, each supplement provides 425 kcal and 19 grams protein  MVI daily  NUTRITION DIAGNOSIS:   Increased nutrient needs related to acute illness(COVID 19) as evidenced by increased estimated needs.  GOAL:   Patient will meet greater than or equal to 90% of their needs  MONITOR:   PO intake, Supplement acceptance, Labs, Weight trends, Skin, I & O's  REASON FOR ASSESSMENT:   Consult Assessment of nutrition requirement/status  ASSESSMENT:   33 y/o male with morbid obesity and new DM admitted with COVID 19 PNA   Pt with good appetite and oral intake; pt ate 100% of his breakfast today. RD will add supplements to help pt meet his estimated needs. Per chart, pt down 37lbs(11%) since April; RD unsure if weight loss was intentional or how recently weight loss occurred.  Medications reviewed and include: vitamin C, lovenox, insulin, MVI, zinc, azithromycin, ceftriaxone  Labs reviewed: K 4.4 wnl, creat 0.48(L), P 4.0 wnl, Mg 2.0 wnl Wbc- 3.6(L) 340, 339, 365, 314, 410 x 24 hrs AIC 12.3(H)- 12/28  Unable to complete Nutrition-Focused physical exam at this time.   Diet Order:   Diet Order            Diet Carb Modified Fluid consistency: Thin; Room service appropriate? Yes  Diet effective now             EDUCATION NEEDS:   Not appropriate for education at this time  Skin:  Skin Assessment: Reviewed RN Assessment  Last BM:  12/30  Height:   Ht Readings from Last 1 Encounters:  05/26/19 6' (1.829 m)    Weight:   Wt Readings from Last 1 Encounters:  05/26/19 (!) 137.9 kg    Ideal Body Weight:  80.9 kg  BMI:  Body mass index is 41.23 kg/m.  Estimated Nutritional Needs:   Kcal:  3100-3400kcal/day  Protein:  >150g/day  Fluid:  >2.4L/day  Koleen Distance MS, RD, LDN Pager #- [phone] Office#- [phone] After Hours Pager: [phone]

## 2019-05-29 NOTE — Progress Notes (Signed)
Notified Dr. Priscella Mann that patients nose is bleeding and spitting up some blood. He was going to the bathroom and having a coughing episode. Dr. Priscella Mann ordered to put hydration on oxygen. Patient needs to get up and use his IS and flutter. I encouraged patient to do all these, he is now in his chair.

## 2019-05-30 DIAGNOSIS — J1282 Pneumonia due to Coronavirus disease 2019: Secondary | ICD-10-CM

## 2019-05-30 LAB — FIBRIN DERIVATIVES D-DIMER (ARMC ONLY): Fibrin derivatives D-dimer (ARMC): 530.06 ng/mL (FEU) — ABNORMAL HIGH (ref 0.00–499.00)

## 2019-05-30 LAB — CBC WITH DIFFERENTIAL/PLATELET
Abs Immature Granulocytes: 0.04 10*3/uL (ref 0.00–0.07)
Basophils Absolute: 0 10*3/uL (ref 0.0–0.1)
Basophils Relative: 0 %
Eosinophils Absolute: 0 10*3/uL (ref 0.0–0.5)
Eosinophils Relative: 0 %
HCT: 45.4 % (ref 39.0–52.0)
Hemoglobin: 15.3 g/dL (ref 13.0–17.0)
Immature Granulocytes: 1 %
Lymphocytes Relative: 15 %
Lymphs Abs: 0.7 10*3/uL (ref 0.7–4.0)
MCH: 28.4 pg (ref 26.0–34.0)
MCHC: 33.7 g/dL (ref 30.0–36.0)
MCV: 84.4 fL (ref 80.0–100.0)
Monocytes Absolute: 0.4 10*3/uL (ref 0.1–1.0)
Monocytes Relative: 8 %
Neutro Abs: 3.3 10*3/uL (ref 1.7–7.7)
Neutrophils Relative %: 76 %
Platelets: 248 10*3/uL (ref 150–400)
RBC: 5.38 MIL/uL (ref 4.22–5.81)
RDW: 12 % (ref 11.5–15.5)
WBC: 4.4 10*3/uL (ref 4.0–10.5)
nRBC: 0 % (ref 0.0–0.2)

## 2019-05-30 LAB — GLUCOSE, CAPILLARY
Glucose-Capillary: 337 mg/dL — ABNORMAL HIGH (ref 70–99)
Glucose-Capillary: 282 mg/dL — ABNORMAL HIGH (ref 70–99)
Glucose-Capillary: 286 mg/dL — ABNORMAL HIGH (ref 70–99)
Glucose-Capillary: 258 mg/dL — ABNORMAL HIGH (ref 70–99)

## 2019-05-30 LAB — COMPREHENSIVE METABOLIC PANEL
ALT: 29 U/L (ref 0–44)
AST: 20 U/L (ref 15–41)
Albumin: 2.6 g/dL — ABNORMAL LOW (ref 3.5–5.0)
Alkaline Phosphatase: 64 U/L (ref 38–126)
Anion gap: 8 (ref 5–15)
BUN: 13 mg/dL (ref 6–20)
CO2: 24 mmol/L (ref 22–32)
Calcium: 8.3 mg/dL — ABNORMAL LOW (ref 8.9–10.3)
Chloride: 105 mmol/L (ref 98–111)
Creatinine, Ser: 0.37 mg/dL — ABNORMAL LOW (ref 0.61–1.24)
GFR calc Af Amer: >60 mL/min (ref >60)
GFR calc non Af Amer: >60 mL/min (ref >60)
Glucose, Bld: 353 mg/dL — ABNORMAL HIGH (ref 70–99)
Potassium: 4.6 mmol/L (ref 3.5–5.1)
Sodium: 137 mmol/L (ref 135–145)
Total Bilirubin: 0.9 mg/dL (ref 0.3–1.2)
Total Protein: 6.3 g/dL — ABNORMAL LOW (ref 6.5–8.1)

## 2019-05-30 LAB — C-REACTIVE PROTEIN: CRP: 1.7 mg/dL — ABNORMAL HIGH (ref <1.0)

## 2019-05-30 LAB — MAGNESIUM: Magnesium: 2 mg/dL (ref 1.7–2.4)

## 2019-05-30 LAB — PHOSPHORUS: Phosphorus: 3.4 mg/dL (ref 2.5–4.6)

## 2019-05-30 LAB — FERRITIN: Ferritin: 639 ng/mL — ABNORMAL HIGH (ref 24–336)

## 2019-05-30 NOTE — Progress Notes (Signed)
PROGRESS NOTE    Wesley Meyer  OEV:[id number] DOB: April 19, 1986 DOA: 05/26/2019 PCP: Patient, No Pcp Per   Brief Narrative:  [name] a 34 y.o.malewho is morbidly obese and with no significant past medical history who presented to the emergency room with a 1 week history of shortness of breath. Developed a fever on 12/20/20and tested positive for Covid on 05/20/2019.  12/30: No status changes over interval.  Patient appears somewhat lethargic.  Endorses fatigue.  Remains on 2 L nasal cannula  12/31: No acute status changes.  Hyperglycemia remains refractory and difficult to control.  Patient is reluctant to taking insulin as outpatient as he feels that it will impact his job.  Remains on 2 L nasal cannula.  1/1: No changes over interval.  Slightly improved glycemic control.  Remains on 2 L nasal cannula.  Assessment & Plan:   Active Problems:   Pneumonia due to COVID-19 virus   Hypoxia   Hyperglycemia  #Acute hypoxic respiratory failure 2/2 Pneumonia due to COVID-19 virus --On presentation, tachypneic at 24 with oxygen saturation 88 on room air improving to 95% on O2 at 2 L.  X-ray showed airspace opacities consistent with pneumonia. PLAN: --continue dexamethasone and remdesivir, Day 5 --vitamins --Supplemental oxygen to keep sats over 90% --Follow inflammatory markers --Incentive spirometry and chest PT --Attempt to wean patient from supplemental oxygen.  If unsuccessful will order home oxygen therapy. --Plan for discharge home in the morning --Patient has been seen by physical therapy with not recommending any PT follow-up.  # Diabetes type 2, new diagnosis, uncontrolled Glycemic control remains a challenge A1c 12.7 Aggressive Lantus and scheduled lispro regimen Sliding scale insulin coverage Carb controlled diet Diabetes coordinator following, recs appreciated Will need insulin on discharge We will prescribe home insulin regimen and home  health RN for medication administration and education  Stage III obesity, BMI 41.32 Counseled patient Dietitian consult  DVT prophylaxis: Lovenox SQ Code Status: Full code  Family Communication: none today Disposition Plan: Home     Consultants:   None  Procedures: None Antimicrobials: Remdesivir (05/26/2019- )  Subjective: Seen and examined Fatigue improving No new complaints  Objective: Vitals:   05/29/19 0534 05/29/19 1159 05/29/19 1949 05/30/19 0433  BP: 118/79 115/75 (!) 116/56 106/65  Pulse: 69 78 80 63  Resp: 20 (!) 24 20 (!) 24  Temp: 98.2 F (36.8 C) (!) 97.5 F (36.4 C) 97.7 F (36.5 C) (!) 97.5 F (36.4 C)  TempSrc: Oral Oral Oral Oral  SpO2: 95% 98% 95% 95%  Weight:      Height:        Intake/Output Summary (Last 24 hours) at 05/30/2019 1142 Last data filed at 05/30/2019 0451 Gross per 24 hour  Intake 836.85 ml  Output 1925 ml  Net -1088.15 ml   Filed Weights   05/26/19 1328  Weight: (!) 137.9 kg    Examination:   Constitutional: NAD, AAOx3 HEENT: conjunctivae and lids normal, EOMI CV: RRR no M,R,G. Distal pulses +2.  No cyanosis.   RESP: diffuse rhonchi, normal respiratory effort, on 2L GI: +BS, NTND Extremities: No effusions, edema, or tenderness in BLE MSK: no joint enlargement or tenderness of both UE and LE SKIN: warm, dry and intact Neuro: II - XII grossly intact.  Sensation intact Psych: Normal mood and affect.  Appropriate judgement and reason   Data Reviewed: I have personally reviewed following labs and imaging studies  CBC: Recent Labs  Lab 05/26/19 2240 05/27/19 0611 05/28/19 0338 05/29/19  1610 05/30/19 0352  WBC 3.9* 2.8* 3.9* 3.6* 4.4  NEUTROABS  --  1.8 2.7 2.7 3.3  HGB 15.5 15.8 15.1 15.6 15.3  HCT 45.0 45.1 44.5 44.1 45.4  MCV 84.0 82.1 84.4 81.2 84.4  PLT 155 183 217 241 960   Basic Metabolic Panel: Recent Labs  Lab 05/26/19 1922 05/27/19 0611 05/28/19 0338 05/29/19 0649 05/30/19 0352  NA 131* 135  134* 135 137  K 4.0 4.6 4.3 4.4 4.6  CL 99 103 103 103 105  CO2 20* 20* 21* 23 24  GLUCOSE 338* 350* 332* 360* 353*  BUN 8 11 14 15 13   CREATININE 0.64 0.67 0.54* 0.48* 0.37*  CALCIUM 8.3* 8.4* 8.3* 8.2* 8.3*  MG  --  2.2 2.1 2.0 2.0  PHOS  --  4.6 3.4 4.0 3.4   GFR: Estimated Creatinine Clearance: 187.2 mL/min (A) (by C-G formula based on SCr of 0.37 mg/dL (L)). Liver Function Tests: Recent Labs  Lab 05/26/19 1922 05/27/19 0611 05/28/19 0338 05/29/19 0649 05/30/19 0352  AST 44* 33 26 19 20   ALT 51* 46* 40 33 29  ALKPHOS 78 73 76 64 64  BILITOT 1.0 1.1 1.0 1.0 0.9  PROT 7.2 7.2 6.7 6.7 6.3*  ALBUMIN 3.2* 2.9* 2.8* 2.7* 2.6*   No results for input(s): LIPASE, AMYLASE in the last 168 hours. No results for input(s): AMMONIA in the last 168 hours. Coagulation Profile: No results for input(s): INR, PROTIME in the last 168 hours. Cardiac Enzymes: No results for input(s): CKTOTAL, CKMB, CKMBINDEX, TROPONINI in the last 168 hours. BNP (last 3 results) No results for input(s): PROBNP in the last 8760 hours. HbA1C: No results for input(s): HGBA1C in the last 72 hours. CBG: Recent Labs  Lab 05/29/19 0757 05/29/19 1200 05/29/19 1614 05/29/19 1948 05/30/19 0752  GLUCAP 410* 343* 309* 344* 337*   Lipid Profile: No results for input(s): CHOL, HDL, LDLCALC, TRIG, CHOLHDL, LDLDIRECT in the last 72 hours. Thyroid Function Tests: No results for input(s): TSH, T4TOTAL, FREET4, T3FREE, THYROIDAB in the last 72 hours. Anemia Panel: Recent Labs    05/29/19 0649 05/30/19 0352  FERRITIN 695* 639*   Sepsis Labs: Recent Labs  Lab 05/26/19 1922 05/27/19 0007  PROCALCITON <0.10  --   LATICACIDVEN 1.6 1.9    Recent Results (from the past 240 hour(s))  Blood Culture (routine x 2)     Status: None (Preliminary result)   Collection Time: 05/26/19  7:23 PM   Specimen: BLOOD  Result Value Ref Range Status   Specimen Description BLOOD BLOOD LEFT HAND  Final   Special Requests    Final    BOTTLES DRAWN AEROBIC AND ANAEROBIC Blood Culture adequate volume   Culture   Final    NO GROWTH 4 DAYS Performed at [facility], [address] [place], [place] [address]    Report Status PENDING  Incomplete  Blood Culture (routine x 2)     Status: None (Preliminary result)   Collection Time: 05/26/19  7:23 PM   Specimen: BLOOD  Result Value Ref Range Status   Specimen Description BLOOD RIGHT ANTECUBITAL  Final   Special Requests   Final    BOTTLES DRAWN AEROBIC AND ANAEROBIC Blood Culture adequate volume   Culture   Final    NO GROWTH 4 DAYS Performed at [facility], [address] [place], [place] [address]    Report Status PENDING  Incomplete         Radiology Studies: No results  found.      Scheduled Meds: . albuterol  2 puff Inhalation Q6H  . vitamin C  500 mg Oral Daily  . dexamethasone  6 mg Oral Q24H  . enoxaparin (LOVENOX) injection  40 mg Subcutaneous BID  . feeding supplement (NEPRO CARB STEADY)  237 mL Oral TID BM  . insulin aspart  0-20 Units Subcutaneous TID WC  . insulin aspart  0-5 Units Subcutaneous QHS  . insulin aspart  4 Units Subcutaneous TID WC  . insulin glargine  28 Units Subcutaneous Daily  . multivitamin with minerals  1 tablet Oral Daily  . zinc sulfate  220 mg Oral Daily   Continuous Infusions: . sodium chloride Stopped (05/29/19 2305)  . azithromycin Stopped (05/29/19 2305)  . cefTRIAXone (ROCEPHIN)  IV Stopped (05/29/19 2146)     LOS: 4 days    Time spent: 35 minutes     Tresa Moore, MD [facility] Pager [phone]  If 7PM-7AM, please contact night-coverage [email or web address] Password [facility] 05/30/2019, 11:42 AM

## 2019-05-30 NOTE — Evaluation (Signed)
Physical Therapy Evaluation Patient Details Name: Wesley Meyer MRN: [id number] DOB: [birth year] Today's Date: 05/30/2019   History of Present Illness  presented to ER secondary to cough, SOB; admitted for management of acute hypoxic respiratory failure related to COVID-19 PNA.  Clinical Impression  Upon evaluation, patient alert and oriented; follows commands and demonstrates good effort with mobility tasks.  Fluent in [place]; participates well with and understands all conversation.  Bilat UE/LE strength and ROM grossly symmetrical and WFL; no focal weakness appreciated.  Moderate dry coughing noted with minimal exertion; consistent cuing for breath control and pursed lip breathing.  Able to complete all functional mobility with mod indep, no assist device required.  Does require intermittent cuing for activity pacing, energy conservation to maintain O2 sats with activity. Of note, desat to 86-87% at rest on RA, requiring 2L supplemental O2 to maintain sats at rest and with exertion.  Anticipate possible need for supplemental O2 upon discharge.  RN informed/aware. Would benefit from skilled PT to address above deficits and promote optimal return to PLOF.; will maintain on caseload throughout remaining stay.  Do not anticipate formal PT needs upon discharge.  SaO2 on room air at rest = 86% SaO2 on room air while ambulating = 86% SaO2 on 2 liters of O2 while ambulating = 90%       Follow Up Recommendations No PT follow up    Equipment Recommendations       Recommendations for Other Services       Precautions / Restrictions Precautions Precautions: None Restrictions Weight Bearing Restrictions: No      Mobility  Bed Mobility Overal bed mobility: Modified Independent                Transfers Overall transfer level: Modified independent Equipment used: None             General transfer comment: good LE strength, power and overall  control  Ambulation/Gait Ambulation/Gait assistance: Modified independent (Device/Increase time) Gait Distance (Feet): 50 Feet Assistive device: None       General Gait Details: reciprocal stepping pattern with fair trunk rotation, arm swing; changes direction, navigates room space without difficulty.  Mild coughing with exertion; sats >90% on 2L supplemental O2 via [place].  Stairs            Wheelchair Mobility    Modified Rankin (Stroke Patients Only)       Balance Overall balance assessment: Needs assistance Sitting-balance support: No upper extremity supported;Feet supported Sitting balance-Leahy Scale: Good     Standing balance support: No upper extremity supported Standing balance-Leahy Scale: Good                               Pertinent Vitals/Pain Pain Assessment: No/denies pain    Home Living Family/patient expects to be discharged to:: Private residence Living Arrangements: Spouse/significant other(4 children, ages 47-17) Available Help at Discharge: Family;Available 24 hours/day Type of Home: House Home Access: Stairs to enter Entrance Stairs-Rails: Right Entrance Stairs-Number of Steps: 4-5 Home Layout: Two level;Able to live on main level with bedroom/bathroom Home Equipment: None      Prior Function Level of Independence: Independent         Comments: Indep with ADLS, household, community mobilization without assist device; no home O2.  Working as Administrator (locally)     Journalist, newspaper        Extremity/Trunk Assessment   Upper Extremity Assessment Upper Extremity Assessment:  Overall WFL for tasks assessed    Lower Extremity Assessment Lower Extremity Assessment: Overall WFL for tasks assessed(grossly 4+ to 5/5 throughout)       Communication   Communication: No difficulties  Cognition Arousal/Alertness: Awake/alert Behavior During Therapy: WFL for tasks assessed/performed Overall Cognitive Status: Within Functional  Limits for tasks assessed                                        General Comments      Exercises Other Exercises Other Exercises: Toilet transfer, ambulatory without assist device, mod indep Other Exercises: 41' without assist device, mod indep-noted desat to 86% with exertion on RA  Reviewed use of incentive spirometer and flutter valve; patient able to return demonstration of both devices.  Encouraged continued use throughout day as directed by care team.   Assessment/Plan    PT Assessment Patient needs continued PT services  PT Problem List Cardiopulmonary status limiting activity       PT Treatment Interventions Gait training;Stair training;Functional mobility training;Therapeutic activities;Patient/family education    PT Goals (Current goals can be found in the Care Plan section)  Acute Rehab PT Goals Patient Stated Goal: to get home to wife and kids PT Goal Formulation: With patient Time For Goal Achievement: 06/13/19 Potential to Achieve Goals: Good Additional Goals Additional Goal #1: Indep understanding and demonstration of energy conservation, activity pacing techniques with all funcitonal activities.    Frequency Min 2X/week   Barriers to discharge        Co-evaluation               AM-PAC PT "6 Clicks" Mobility  Outcome Measure Help needed turning from your back to your side while in a flat bed without using bedrails?: None Help needed moving from lying on your back to sitting on the side of a flat bed without using bedrails?: None Help needed moving to and from a bed to a chair (including a wheelchair)?: None Help needed standing up from a chair using your arms (e.g., wheelchair or bedside chair)?: None Help needed to walk in hospital room?: None Help needed climbing 3-5 steps with a railing? : None 6 Click Score: 24    End of Session Equipment Utilized During Treatment: Oxygen Activity Tolerance: Patient tolerated treatment well    Nurse Communication: Mobility status PT Visit Diagnosis: Difficulty in walking, not elsewhere classified (R26.2)    Time: 1829-9371 PT Time Calculation (min) (ACUTE ONLY): 36 min   Charges:   PT Evaluation $PT Eval Moderate Complexity: 1 Mod PT Treatments $Gait Training: 8-22 mins $Therapeutic Activity: 8-22 mins        Shavonne Ambroise H. Manson Passey, PT, DPT, NCS 05/30/19, 11:26 AM [phone]

## 2019-05-31 DIAGNOSIS — E1165 Type 2 diabetes mellitus with hyperglycemia: Secondary | ICD-10-CM

## 2019-05-31 DIAGNOSIS — J9601 Acute respiratory failure with hypoxia: Secondary | ICD-10-CM

## 2019-05-31 LAB — GLUCOSE, CAPILLARY
Glucose-Capillary: 355 mg/dL — ABNORMAL HIGH (ref 70–99)
Glucose-Capillary: 307 mg/dL — ABNORMAL HIGH (ref 70–99)

## 2019-05-31 LAB — CBC WITH DIFFERENTIAL/PLATELET
Abs Immature Granulocytes: 0.03 10*3/uL (ref 0.00–0.07)
Basophils Absolute: 0 10*3/uL (ref 0.0–0.1)
Basophils Relative: 0 %
Eosinophils Absolute: 0 10*3/uL (ref 0.0–0.5)
Eosinophils Relative: 0 %
HCT: 45.4 % (ref 39.0–52.0)
Hemoglobin: 15.2 g/dL (ref 13.0–17.0)
Immature Granulocytes: 1 %
Lymphocytes Relative: 17 %
Lymphs Abs: 0.8 10*3/uL (ref 0.7–4.0)
MCH: 28.4 pg (ref 26.0–34.0)
MCHC: 33.5 g/dL (ref 30.0–36.0)
MCV: 84.7 fL (ref 80.0–100.0)
Monocytes Absolute: 0.3 10*3/uL (ref 0.1–1.0)
Monocytes Relative: 7 %
Neutro Abs: 3.6 10*3/uL (ref 1.7–7.7)
Neutrophils Relative %: 75 %
Platelets: 264 10*3/uL (ref 150–400)
RBC: 5.36 MIL/uL (ref 4.22–5.81)
RDW: 11.9 % (ref 11.5–15.5)
WBC: 4.8 10*3/uL (ref 4.0–10.5)
nRBC: 0 % (ref 0.0–0.2)

## 2019-05-31 LAB — COMPREHENSIVE METABOLIC PANEL
ALT: 40 U/L (ref 0–44)
AST: 33 U/L (ref 15–41)
Albumin: 2.7 g/dL — ABNORMAL LOW (ref 3.5–5.0)
Alkaline Phosphatase: 68 U/L (ref 38–126)
Anion gap: 8 (ref 5–15)
BUN: 12 mg/dL (ref 6–20)
CO2: 25 mmol/L (ref 22–32)
Calcium: 8.3 mg/dL — ABNORMAL LOW (ref 8.9–10.3)
Chloride: 101 mmol/L (ref 98–111)
Creatinine, Ser: 0.55 mg/dL — ABNORMAL LOW (ref 0.61–1.24)
GFR calc Af Amer: >60 mL/min (ref >60)
GFR calc non Af Amer: >60 mL/min (ref >60)
Glucose, Bld: 373 mg/dL — ABNORMAL HIGH (ref 70–99)
Potassium: 4.9 mmol/L (ref 3.5–5.1)
Sodium: 134 mmol/L — ABNORMAL LOW (ref 135–145)
Total Bilirubin: 0.6 mg/dL (ref 0.3–1.2)
Total Protein: 6.3 g/dL — ABNORMAL LOW (ref 6.5–8.1)

## 2019-05-31 LAB — CULTURE, BLOOD (ROUTINE X 2)
Culture: NO GROWTH
Culture: NO GROWTH
Special Requests: ADEQUATE
Special Requests: ADEQUATE

## 2019-05-31 LAB — FERRITIN: Ferritin: 647 ng/mL — ABNORMAL HIGH (ref 24–336)

## 2019-05-31 LAB — PHOSPHORUS: Phosphorus: 3.4 mg/dL (ref 2.5–4.6)

## 2019-05-31 LAB — FIBRIN DERIVATIVES D-DIMER (ARMC ONLY): Fibrin derivatives D-dimer (ARMC): 475.15 ng/mL (FEU) (ref 0.00–499.00)

## 2019-05-31 LAB — MAGNESIUM: Magnesium: 2 mg/dL (ref 1.7–2.4)

## 2019-05-31 LAB — C-REACTIVE PROTEIN: CRP: 1.1 mg/dL — ABNORMAL HIGH (ref <1.0)

## 2019-05-31 MED ORDER — ADULT MULTIVITAMIN W/MINERALS CH: 1.0000 | ORAL_TABLET | Freq: Every day | ORAL | 0 refills | Status: AC

## 2019-05-31 MED ORDER — FREESTYLE TEST VI STRP: ORAL_STRIP | 5 refills | Status: AC

## 2019-05-31 MED ORDER — ASCORBIC ACID 500 MG PO TABS: 500.0000 mg | ORAL_TABLET | Freq: Every day | ORAL | 0 refills | Status: AC

## 2019-05-31 MED ORDER — INSULIN STARTER KIT- SYRINGES (ENGLISH)
1.0000 | Freq: Once | Status: AC
  Administered 2019-05-31: 12:00:00 1
  Filled 2019-05-31: qty 1

## 2019-05-31 MED ORDER — DEXAMETHASONE 6 MG PO TABS: 6.0000 mg | ORAL_TABLET | ORAL | 0 refills | Status: AC

## 2019-05-31 MED ORDER — ZINC SULFATE 220 (50 ZN) MG PO CAPS: 220.0000 mg | ORAL_CAPSULE | Freq: Every day | ORAL | 0 refills | Status: AC

## 2019-05-31 MED ORDER — ACCU-CHEK MULTICLIX LANCETS MISC: 5 refills | Status: AC

## 2019-05-31 MED ORDER — INSULIN GLARGINE 100 UNIT/ML ~~LOC~~ SOLN: 28.0000 [IU] | Freq: Every day | SUBCUTANEOUS | 11 refills | Status: AC

## 2019-05-31 MED ORDER — INSULIN STARTER KIT- SYRINGES (ENGLISH): 1.0000 | Freq: Once | 0 refills | Status: AC

## 2019-05-31 MED ORDER — INSULIN ASPART 100 UNIT/ML ~~LOC~~ SOLN: 4.0000 [IU] | Freq: Three times a day (TID) | SUBCUTANEOUS | 11 refills | Status: AC

## 2019-05-31 NOTE — Progress Notes (Signed)
Insulin starter Kit given and explained to patient. Patient verbalizes understanding and demonstrated back on how to draw insulin and self administration. All questions answered. Printed AVS and prescription given to patient. IV removed. Pt escorted out via wheelchair.

## 2019-05-31 NOTE — Discharge Summary (Signed)
Physician Discharge Summary  Sandro Burgo XBD:[id number] DOB: 1985-06-10 DOA: 05/26/2019  PCP: Patient, No Pcp Per  Admit date: 05/26/2019 Discharge date: 05/31/2019  Admitted From: Home Disposition: Home  Recommendations for Outpatient Follow-up:  1. Follow up with PCP in 1-2 weeks 2.   Home Health: Yes Equipment/Devices: No Discharge Condition: Stable CODE STATUS: Full Diet recommendation: Carb modified  Brief/Interim Summary: Lindie Spruce a 34 y.o.malewho is morbidly obese and with no significant past medical history who presentedto the emergency room with a 1 week history of shortness of breath. Developed a fever on 12/20/20and tested positive for Covid on 05/20/2019.  12/30: No status changes over interval.  Patient appears somewhat lethargic.  Endorses fatigue.  Remains on 2 L nasal cannula  12/31: No acute status changes.  Hyperglycemia remains refractory and difficult to control.  Patient is reluctant to taking insulin as outpatient as he feels that it will impact his job.  Remains on 2 L nasal cannula.  Janyce Llanos Morais a 34 y.o.malewho is morbidly obese and with no significant past medical history who presentedto the emergency room with a 1 week history of shortness of breath. Developed a fever on 12/20/20and tested positive for Covid on 05/20/2019.  1/1: No changes over interval.  Slightly improved glycemic control.  Remains on 2 L nasal cannula.  1/2: Titrated off supplemental oxygen.  Stable on room air.  Improved glycemic control.  Energy improved.  Discharge Diagnoses:  Active Problems:   Pneumonia due to COVID-19 virus   Hypoxia   Hyperglycemia  #Acute hypoxic respiratory failure 2/2Pneumonia due to COVID-19 virus --On presentation,tachypneic at 24 with oxygen saturation 88 on room air improving to 95% on O2 at 2 L. X-ray showed airspace opacities consistent with pneumonia. PLAN: --Completed remdesivir 5-day  course --Complete steroid course on discharge --vitamins, prescribed on discharge --Titrated off oxygen and ambulated prior to discharge.  Stable on room air --Recommend patient to buy a pulse oximeter to check oxygen saturation at home.  #Diabetes type 2, new diagnosis, uncontrolled Glycemic control remains a challenge A1c 12.7 Patient was told he was diabetic and was briefly on Metformin however he had lost weight and self discontinued medication Diabetic coordinator following throughout hospital course Insulin regimen prescribed on discharge Home health skilled nursing ordered for diabetic education Patient is instructed to establish care with primary care to which he agreed  Stage III obesity, BMI 41.32 Counseled patient Dietitian consult   Discharge Instructions  Discharge Instructions    Diet - low sodium heart healthy   Complete by: As directed    Increase activity slowly   Complete by: As directed    MyChart COVID-19 home monitoring program   Complete by: May 31, 2019    Is the patient willing to use the [facility] for home monitoring?: Yes   Temperature monitoring   Complete by: May 31, 2019    After how many days would you like to receive a notification of this patient's flowsheet entries?: 1     Allergies as of 05/31/2019   No Known Allergies     Medication List    STOP taking these medications   ibuprofen 200 MG tablet Commonly known as: ADVIL     TAKE these medications   accu-chek multiclix lancets Use as directed   acetaminophen 325 MG tablet Commonly known as: TYLENOL Take 650 mg by mouth every 6 (six) hours as needed.   ascorbic acid 500 MG tablet Commonly known as: VITAMIN C Take  1 tablet (500 mg total) by mouth daily.   dexamethasone 6 MG tablet Commonly known as: DECADRON Take 1 tablet (6 mg total) by mouth daily for 4 days.   FREESTYLE TEST STRIPS test strip Generic drug: glucose blood Use as directed by home health RN and  diabetes educator   insulin aspart 100 UNIT/ML injection Commonly known as: novoLOG Inject 4 Units into the skin 3 (three) times daily with meals.   insulin glargine 100 UNIT/ML injection Commonly known as: LANTUS Inject 0.28 mLs (28 Units total) into the skin daily. Start taking on: June 01, 2019   insulin starter kit- syringes Misc 1 kit by Other route once for 1 dose.   multivitamin with minerals Tabs tablet Take 1 tablet by mouth daily. Start taking on: June 01, 2019   zinc sulfate 220 (50 Zn) MG capsule Take 1 capsule (220 mg total) by mouth daily.            Durable Medical Equipment  (From admission, onward)         Start     Ordered   05/31/19 0920  DME Glucometer  Once     05/31/19 9597   05/30/19 1134  For home use only DME oxygen  Once    Question Answer Comment  Length of Need 6 Months   Mode or (Route) Nasal cannula   Liters per Minute 2   Frequency Continuous (stationary and portable oxygen unit needed)   Oxygen conserving device Yes   Oxygen delivery system Gas      05/30/19 1133          No Known Allergies  Consultations:  None   Procedures/Studies: DG Chest 2 View  Result Date: 05/26/2019 CLINICAL DATA:  Shortness of breath. Additional provided: Shortness of breath and cough for 2 days. EXAM: CHEST - 2 VIEW COMPARISON:  No pertinent prior studies available for comparison. FINDINGS: Shallow inspiration radiograph with accentuation of the cardiac silhouette and crowding of the central bronchovascular markings. The heart does not appear enlarged. Extensive ill-defined opacities throughout both lungs. No evidence of pleural effusion or pneumothorax. No acute bony abnormality. IMPRESSION: Shallow inspiration radiograph. Extensive ill-defined airspace opacities throughout both lungs are nonspecific, but consistent with pneumonia in the appropriate clinical setting. Clinical correlation and radiographic follow-up to resolution recommended.  Electronically Signed   By: Kellie Simmering DO   On: 05/26/2019 14:04       Subjective: Seen and examined on the day of discharge No acute status changes over interval No new complaints Titrated off supplemental oxygen  Discharge Exam: Vitals:   05/31/19 0521 05/31/19 1227  BP: 121/78 121/75  Pulse: 66 84  Resp: 20 20  Temp: 97.7 F (36.5 C) 99.2 F (37.3 C)  SpO2: 96% 96%   Vitals:   05/30/19 1408 05/30/19 2103 05/31/19 0521 05/31/19 1227  BP: 122/80 123/75 121/78 121/75  Pulse: 80 74 66 84  Resp: (!) 24 (!) _0 Temp: 98.7 F (37.1 C) 98 F (36.7 C) 97.7 F (36.5 C) 99.2 F (37.3 C)  TempSrc: Oral Oral Oral Oral  SpO2: 94% 98% 96% 96%  Weight:      Height:       Constitutional: NAD, AAOx3 HEENT: conjunctivae and lids normal, EOMI CV: RRR no M,R,G. Distal pulses +2. No cyanosis.  RESP:diffuse rhonchi, normal respiratory effort, on 2L GI: +BS, NTND Extremities: No effusions, edema, or tenderness in BLE MSK: no joint enlargement or tenderness of both UE and LE  SKIN: warm, dry and intact Neuro: II - XII grossly intact. Sensation intact Psych: Normal mood and affect. Appropriate judgement and reason      The results of significant diagnostics from this hospitalization (including imaging, microbiology, ancillary and laboratory) are listed below for reference.     Microbiology: Recent Results (from the past 240 hour(s))  Blood Culture (routine x 2)     Status: None   Collection Time: 05/26/19  7:23 PM   Specimen: BLOOD  Result Value Ref Range Status   Specimen Description BLOOD BLOOD LEFT HAND  Final   Special Requests   Final    BOTTLES DRAWN AEROBIC AND ANAEROBIC Blood Culture adequate volume   Culture   Final    NO GROWTH 5 DAYS Performed at [facility], [address], [place], [place] [address]    Report Status 05/31/2019 FINAL  Final  Blood Culture (routine x 2)     Status: None   Collection Time: 05/26/19  7:23 PM    Specimen: BLOOD  Result Value Ref Range Status   Specimen Description BLOOD RIGHT ANTECUBITAL  Final   Special Requests   Final    BOTTLES DRAWN AEROBIC AND ANAEROBIC Blood Culture adequate volume   Culture   Final    NO GROWTH 5 DAYS Performed at [facility], [address] [place], [place] [address]    Report Status 05/31/2019 FINAL  Final     Labs: BNP (last 3 results) No results for input(s): BNP in the last 8760 hours. Basic Metabolic Panel: Recent Labs  Lab 05/27/19 0611 05/28/19 0338 05/29/19 0649 05/30/19 0352 05/31/19 0553  NA 135 134* 135 137 134*  K 4.6 4.3 4.4 4.6 4.9  CL 103 103 103 105 101  CO2 20* 21* _0 GLUCOSE 350* 332* 360* 353* 373*  BUN _1 CREATININE 0.67 0.54* 0.48* 0.37* 0.55*  CALCIUM 8.4* 8.3* 8.2* 8.3* 8.3*  MG 2.2 2.1 2.0 2.0 2.0  PHOS 4.6 3.4 4.0 3.4 3.4   Liver Function Tests: Recent Labs  Lab 05/27/19 0611 05/28/19 0338 05/29/19 0649 05/30/19 0352 05/31/19 0553  AST 33 _2 33  ALT 46* 40 33 29 40  ALKPHOS 73 76 64 64 68  BILITOT 1.1 1.0 1.0 0.9 0.6  PROT 7.2 6.7 6.7 6.3* 6.3*  ALBUMIN 2.9* 2.8* 2.7* 2.6* 2.7*   No results for input(s): LIPASE, AMYLASE in the last 168 hours. No results for input(s): AMMONIA in the last 168 hours. CBC: Recent Labs  Lab 05/27/19 0611 05/28/19 0338 05/29/19 0649 05/30/19 0352 05/31/19 0553  WBC 2.8* 3.9* 3.6* 4.4 4.8  NEUTROABS 1.8 2.7 2.7 3.3 3.6  HGB 15.8 15.1 15.6 15.3 15.2  HCT 45.1 44.5 44.1 45.4 45.4  MCV 82.1 84.4 81.2 84.4 84.7  PLT 183 217 241 248 264   Cardiac Enzymes: No results for input(s): CKTOTAL, CKMB, CKMBINDEX, TROPONINI in the last 168 hours. BNP: Invalid input(s): POCBNP CBG: Recent Labs  Lab 05/30/19 1207 05/30/19 1643 05/30/19 2101 05/31/19 0807 05/31/19 1141  GLUCAP 282* 286* 258* 355* 307*   D-Dimer No results for input(s): DDIMER in the last 72 hours. Hgb A1c No results for input(s): HGBA1C in the last 72  hours. Lipid Profile No results for input(s): CHOL, HDL, LDLCALC, TRIG, CHOLHDL, LDLDIRECT in the last 72 hours. Thyroid function studies No results for input(s): TSH, T4TOTAL, T3FREE, THYROIDAB in the last 72 hours.  Invalid input(s): FREET3 Anemia work up  Recent Labs    05/30/19 0352 05/31/19 0553  FERRITIN 639* 647*   Urinalysis    Component Value Date/Time   COLORURINE YELLOW (A) 10/26/2016 0956   APPEARANCEUR Clear 10/30/2016 1608   LABSPEC 1.020 10/26/2016 0956   PHURINE 6.0 10/26/2016 0956   GLUCOSEU Negative 10/30/2016 1608   HGBUR LARGE (A) 10/26/2016 0956   BILIRUBINUR Negative 10/30/2016 [place] 10/26/2016 0956   PROTEINUR Negative 10/30/2016 1608   PROTEINUR 30 (A) 10/26/2016 0956   NITRITE Negative 10/30/2016 1608   NITRITE NEGATIVE 10/26/2016 0956   LEUKOCYTESUR Negative 10/30/2016 1608   Sepsis Labs Invalid input(s): PROCALCITONIN,  WBC,  LACTICIDVEN Microbiology Recent Results (from the past 240 hour(s))  Blood Culture (routine x 2)     Status: None   Collection Time: 05/26/19  7:23 PM   Specimen: BLOOD  Result Value Ref Range Status   Specimen Description BLOOD BLOOD LEFT HAND  Final   Special Requests   Final    BOTTLES DRAWN AEROBIC AND ANAEROBIC Blood Culture adequate volume   Culture   Final    NO GROWTH 5 DAYS Performed at [facility], [address] [place], [place] [address]    Report Status 05/31/2019 FINAL  Final  Blood Culture (routine x 2)     Status: None   Collection Time: 05/26/19  7:23 PM   Specimen: BLOOD  Result Value Ref Range Status   Specimen Description BLOOD RIGHT ANTECUBITAL  Final   Special Requests   Final    BOTTLES DRAWN AEROBIC AND ANAEROBIC Blood Culture adequate volume   Culture   Final    NO GROWTH 5 DAYS Performed at [facility], [address], [place], [place] [address]    Report Status 05/31/2019 FINAL  Final     Time coordinating discharge: Over 30  minutes  SIGNED:   Sidney Ace, MD  [facility] 05/31/2019, 3:20 PM Pager [phone]  If 7PM-7AM, please contact night-coverage [email or web address] Password [email or web address]

## 2019-05-31 NOTE — Progress Notes (Signed)
Pt on room air Sats 98% HR 64 Patient walking in the room on  room air 93% HR 88 Pt has been up to restroom and denies feeling SOB on room air.

## 2022-08-22 ENCOUNTER — Other Ambulatory Visit: Payer: Self-pay | Admitting: Student

## 2022-08-22 ENCOUNTER — Ambulatory Visit
Admission: RE | Admit: 2022-08-22 | Discharge: 2022-08-22 | Disposition: A | Discharge status: Home / Self Care | Payer: Medicaid Other | Source: Ambulatory Visit | Attending: Student | Admitting: Student

## 2022-08-22 ENCOUNTER — Ambulatory Visit
Admission: RE | Admit: 2022-08-22 | Discharge: 2022-08-22 | Disposition: A | Discharge status: Home / Self Care | Payer: Medicaid Other | Attending: Student | Admitting: Student

## 2022-08-22 DIAGNOSIS — M79672 Pain in left foot: Secondary | ICD-10-CM

## 2022-08-26 ENCOUNTER — Other Ambulatory Visit: Payer: Self-pay

## 2022-08-26 ENCOUNTER — Emergency Department: Payer: Medicaid Other

## 2022-08-26 ENCOUNTER — Emergency Department
Admission: EM | Admit: 2022-08-26 | Discharge: 2022-08-26 | Disposition: A | Discharge status: Home / Self Care | Payer: Medicaid Other | Attending: Emergency Medicine | Admitting: Emergency Medicine

## 2022-08-26 DIAGNOSIS — M79672 Pain in left foot: Secondary | ICD-10-CM | POA: Diagnosis present

## 2022-08-26 LAB — BASIC METABOLIC PANEL
Anion gap: 10 (ref 5–15)
BUN: 20 mg/dL (ref 6–20)
CO2: 20 mmol/L — ABNORMAL LOW (ref 22–32)
Calcium: 9.2 mg/dL (ref 8.9–10.3)
Chloride: 101 mmol/L (ref 98–111)
Creatinine, Ser: 0.69 mg/dL (ref 0.61–1.24)
GFR, Estimated: >60 mL/min (ref >60)
Glucose, Bld: 352 mg/dL — ABNORMAL HIGH (ref 70–99)
Potassium: 4 mmol/L (ref 3.5–5.1)
Sodium: 131 mmol/L — ABNORMAL LOW (ref 135–145)

## 2022-08-26 LAB — CBC
HCT: 45.7 % (ref 39.0–52.0)
Hemoglobin: 15.5 g/dL (ref 13.0–17.0)
MCH: 29.3 pg (ref 26.0–34.0)
MCHC: 33.9 g/dL (ref 30.0–36.0)
MCV: 86.4 fL (ref 80.0–100.0)
Platelets: 243 10*3/uL (ref 150–400)
RBC: 5.29 MIL/uL (ref 4.22–5.81)
RDW: 12.6 % (ref 11.5–15.5)
WBC: 7.7 10*3/uL (ref 4.0–10.5)
nRBC: 0 % (ref 0.0–0.2)

## 2022-08-26 MED ORDER — HYDROCODONE-ACETAMINOPHEN 5-325 MG PO TABS
1.0000 | ORAL_TABLET | Freq: Once | ORAL | Status: AC
  Administered 2022-08-26: 1 via ORAL
  Filled 2022-08-26: qty 1

## 2022-08-26 MED ORDER — HYDROCODONE-ACETAMINOPHEN 5-325 MG PO TABS: 1.0000 | ORAL_TABLET | Freq: Four times a day (QID) | ORAL | 0 refills | Status: AC | PRN

## 2022-08-26 MED ORDER — ONDANSETRON 4 MG PO TBDP
4.0000 mg | ORAL_TABLET | Freq: Once | ORAL | Status: AC
  Administered 2022-08-26: 4 mg via ORAL
  Filled 2022-08-26: qty 1

## 2022-08-26 MED ORDER — ONDANSETRON 4 MG PO TBDP: 4.0000 mg | ORAL_TABLET | Freq: Three times a day (TID) | ORAL | 0 refills | Status: AC | PRN

## 2022-08-26 MED ORDER — HYDROCODONE-ACETAMINOPHEN 5-325 MG PO TABS: 1.0000 | ORAL_TABLET | Freq: Four times a day (QID) | ORAL | 0 refills | Status: DC | PRN

## 2022-08-26 MED ORDER — ONDANSETRON 4 MG PO TBDP: 4.0000 mg | ORAL_TABLET | Freq: Three times a day (TID) | ORAL | 0 refills | Status: DC | PRN

## 2022-08-26 NOTE — ED Provider Notes (Signed)
[facility] Provider Note  Patient Contact: 9:29 PM (approximate)   History   Foot Injury (X 2 weeks)   HPI  Wesley Meyer is a 37 y.o. male presents to the emergency department with progressively worsening left foot pain since patient had 2 foot injuries.  Patient initially states that he stepped on a nail.  Patient reports that he also had a board fall on his foot.  He states that he had x-rays taken which showed no signs of fracture.  He has not noticed any redness or streaking but states that his foot has continued to swell.  No calf pain.  No overlying erythema or expression of purulence.      Physical Exam   Triage Vital Signs: ED Triage Vitals  Enc Vitals Group     BP 08/26/22 1955 (!) 142/93     Pulse Rate 08/26/22 1955 88     Resp 08/26/22 1955 16     Temp 08/26/22 1955 97.6 F (36.4 C)     Temp Source 08/26/22 1955 Oral     SpO2 08/26/22 1955 98 %     Weight 08/26/22 1953 (!) 305 lb (138.3 kg)     Height 08/26/22 1953 6' (1.829 m)     Head Circumference --      Peak Flow --      Pain Score 08/26/22 1953 10     Pain Loc --      Pain Edu? --      Excl. in [place]? --     Most recent vital signs: Vitals:   08/26/22 1955  BP: (!) 142/93  Pulse: 88  Resp: 16  Temp: 97.6 F (36.4 C)  SpO2: 98%     General: Alert and in no acute distress. Eyes:  PERRL. EOMI. Head: No acute traumatic findings ENT:      Nose: No congestion/rhinnorhea.      Mouth/Throat: Mucous membranes are moist. Neck: No stridor. No cervical spine tenderness to palpation. Cardiovascular:  Good peripheral perfusion Respiratory: Normal respiratory effort without tachypnea or retractions. Lungs CTAB. Good air entry to the bases with no decreased or absent breath sounds. Gastrointestinal: Bowel sounds 4 quadrants. Soft and nontender to palpation. No guarding or rigidity. No palpable masses. No distention. No CVA tenderness. Musculoskeletal: Full range of  motion to all extremities. Patient has swelling of left foot. Palpable dorsalis pedis pulse, left.  Neurologic:  No gross focal neurologic deficits are appreciated.  Skin:   No rash noted Other:   ED Results / Procedures / Treatments   Labs (all labs ordered are listed, but only abnormal results are displayed) Labs Reviewed  BASIC METABOLIC PANEL - Abnormal; Notable for the following components:      Result Value   Sodium 131 (*)    CO2 20 (*)    Glucose, Bld 352 (*)    All other components within normal limits  CBC       RADIOLOGY  I personally viewed and evaluated these images as part of my medical decision making, as well as reviewing the written report by the radiologist.  ED Provider Interpretation: Possible nondisplaced proximal phalanx fracture of left fifth toe   PROCEDURES:  Critical Care performed: No  Procedures   MEDICATIONS ORDERED IN ED: Medications  HYDROcodone-acetaminophen (NORCO/VICODIN) 5-325 MG per tablet 1 tablet (has no administration in time range)  ondansetron (ZOFRAN-ODT) disintegrating tablet 4 mg (has no administration in time range)     IMPRESSION / MDM /  ASSESSMENT AND PLAN / ED COURSE  I reviewed the triage vital signs and the nursing notes.                              Assessment and plan: Left foot pain:  37 year old male presents to the emergency department with acute left foot pain.  Vital signs are reassuring at triage.  On exam, patient was alert, active and nontoxic-appearing.  CT shows possible nondisplaced left fifth proximal phalanx fracture as well as soft tissue swelling along the dorsal aspect of the foot without other acute abnormality identified.  Patient was placed in a cam boot and patient was prescribed Vicodin for pain.  Return precautions were given to return with new or worsening symptoms.     FINAL CLINICAL IMPRESSION(S) / ED DIAGNOSES   Final diagnoses:  Left foot pain     Rx / DC Orders   ED  Discharge Orders          Ordered    HYDROcodone-acetaminophen (NORCO) 5-325 MG tablet  Every 6 hours PRN        08/26/22 2223    ondansetron (ZOFRAN-ODT) 4 MG disintegrating tablet  Every 8 hours PRN        08/26/22 2223             Note:  This document was prepared using Dragon voice recognition software and may include unintentional dictation errors.   Vallarie Mare [place], PA-C 08/26/22 2228    Rada Hay, MD 08/26/22 [phone]

## 2022-08-26 NOTE — Discharge Instructions (Addendum)
You can take Norco as needed for pain. 

## 2022-08-26 NOTE — ED Triage Notes (Signed)
Pt to ED from home for left foot injury x 2 weeks ago. He stepped on a possibly rusty nail. He was seen at [facility] and received a tetanus shot but no antibiotics were prescribed. Then he dropped a board on  his foot and got an Xray and showed no fractures. Pt has some moderate swelling to left foot. Pt is able to bear weight on his foot but is painful. Pt is CAOx4, no acute distress in triage.
